# Patient Record
Sex: Male | Born: 1935 | Race: White | Hispanic: No | Marital: Married | Smoking: Former smoker
Health system: Southern US, Community
[De-identification: ages and names within clinical notes are randomized; demographics above are authoritative.]

## PROBLEM LIST (undated history)

## (undated) DIAGNOSIS — H3530 Unspecified macular degeneration: Secondary | ICD-10-CM

## (undated) DIAGNOSIS — I10 Essential (primary) hypertension: Secondary | ICD-10-CM

## (undated) DIAGNOSIS — R269 Unspecified abnormalities of gait and mobility: Secondary | ICD-10-CM

## (undated) DIAGNOSIS — I2510 Atherosclerotic heart disease of native coronary artery without angina pectoris: Secondary | ICD-10-CM

## (undated) DIAGNOSIS — N400 Enlarged prostate without lower urinary tract symptoms: Secondary | ICD-10-CM

## (undated) DIAGNOSIS — G20 Parkinson's disease: Secondary | ICD-10-CM

## (undated) DIAGNOSIS — E119 Type 2 diabetes mellitus without complications: Secondary | ICD-10-CM

## (undated) DIAGNOSIS — E785 Hyperlipidemia, unspecified: Secondary | ICD-10-CM

## (undated) DIAGNOSIS — I509 Heart failure, unspecified: Secondary | ICD-10-CM

## (undated) DIAGNOSIS — R5381 Other malaise: Secondary | ICD-10-CM

## (undated) DIAGNOSIS — G20A1 Parkinson's disease without dyskinesia, without mention of fluctuations: Secondary | ICD-10-CM

## (undated) DIAGNOSIS — Z951 Presence of aortocoronary bypass graft: Secondary | ICD-10-CM

## (undated) DIAGNOSIS — D649 Anemia, unspecified: Secondary | ICD-10-CM

## (undated) HISTORY — DX: Unspecified abnormalities of gait and mobility: R26.9

## (undated) HISTORY — DX: Essential (primary) hypertension: I10

## (undated) HISTORY — PX: CORONARY ARTERY BYPASS GRAFT: SHX141

## (undated) HISTORY — DX: Hyperlipidemia, unspecified: E78.5

---

## 1998-04-26 ENCOUNTER — Ambulatory Visit (HOSPITAL_COMMUNITY): Admission: RE | Admit: 1998-04-26 | Discharge: 1998-04-27 | Payer: Self-pay | Admitting: General Surgery

## 2000-03-28 ENCOUNTER — Encounter: Payer: Self-pay | Admitting: Emergency Medicine

## 2000-03-28 ENCOUNTER — Inpatient Hospital Stay (HOSPITAL_COMMUNITY): Admission: EM | Admit: 2000-03-28 | Discharge: 2000-04-07 | Payer: Self-pay | Admitting: Emergency Medicine

## 2000-03-30 ENCOUNTER — Encounter: Payer: Self-pay | Admitting: Cardiovascular Disease

## 2000-04-01 ENCOUNTER — Encounter: Payer: Self-pay | Admitting: Thoracic Surgery (Cardiothoracic Vascular Surgery)

## 2000-04-02 ENCOUNTER — Encounter: Payer: Self-pay | Admitting: Thoracic Surgery (Cardiothoracic Vascular Surgery)

## 2000-04-04 ENCOUNTER — Encounter: Payer: Self-pay | Admitting: Thoracic Surgery (Cardiothoracic Vascular Surgery)

## 2000-04-28 ENCOUNTER — Encounter
Admission: RE | Admit: 2000-04-28 | Discharge: 2000-04-28 | Payer: Self-pay | Admitting: Thoracic Surgery (Cardiothoracic Vascular Surgery)

## 2000-04-28 ENCOUNTER — Encounter: Payer: Self-pay | Admitting: Thoracic Surgery (Cardiothoracic Vascular Surgery)

## 2000-05-24 ENCOUNTER — Encounter: Payer: Self-pay | Admitting: Cardiovascular Disease

## 2000-05-24 ENCOUNTER — Ambulatory Visit (HOSPITAL_COMMUNITY): Admission: RE | Admit: 2000-05-24 | Discharge: 2000-05-24 | Payer: Self-pay | Admitting: Cardiovascular Disease

## 2003-12-03 ENCOUNTER — Observation Stay (HOSPITAL_COMMUNITY): Admission: RE | Admit: 2003-12-03 | Discharge: 2003-12-04 | Payer: Self-pay | Admitting: General Surgery

## 2003-12-03 ENCOUNTER — Encounter (INDEPENDENT_AMBULATORY_CARE_PROVIDER_SITE_OTHER): Payer: Self-pay

## 2004-08-22 ENCOUNTER — Ambulatory Visit (HOSPITAL_COMMUNITY): Admission: RE | Admit: 2004-08-22 | Discharge: 2004-08-22 | Payer: Self-pay | Admitting: Chiropractic Medicine

## 2007-06-11 ENCOUNTER — Inpatient Hospital Stay (HOSPITAL_COMMUNITY): Admission: EM | Admit: 2007-06-11 | Discharge: 2007-06-30 | Payer: Self-pay | Admitting: Emergency Medicine

## 2007-06-11 ENCOUNTER — Ambulatory Visit: Payer: Self-pay | Admitting: Internal Medicine

## 2007-06-13 ENCOUNTER — Encounter (INDEPENDENT_AMBULATORY_CARE_PROVIDER_SITE_OTHER): Payer: Self-pay | Admitting: Internal Medicine

## 2007-06-13 ENCOUNTER — Ambulatory Visit: Payer: Self-pay | Admitting: Internal Medicine

## 2007-06-22 ENCOUNTER — Ambulatory Visit: Payer: Self-pay | Admitting: Physical Medicine & Rehabilitation

## 2007-06-30 ENCOUNTER — Ambulatory Visit: Payer: Self-pay | Admitting: Physical Medicine & Rehabilitation

## 2007-06-30 ENCOUNTER — Inpatient Hospital Stay (HOSPITAL_COMMUNITY)
Admission: RE | Admit: 2007-06-30 | Discharge: 2007-07-11 | Payer: Self-pay | Admitting: Physical Medicine & Rehabilitation

## 2007-07-18 ENCOUNTER — Encounter (HOSPITAL_COMMUNITY): Admission: RE | Admit: 2007-07-18 | Discharge: 2007-10-16 | Payer: Self-pay | Admitting: Family Medicine

## 2007-09-06 ENCOUNTER — Emergency Department (HOSPITAL_COMMUNITY): Admission: EM | Admit: 2007-09-06 | Discharge: 2007-09-06 | Payer: Self-pay | Admitting: *Deleted

## 2008-03-04 IMAGING — CT CT ABDOMEN W/O CM
2 of 3 series · 17 of 46 positions shown, 19 images · IV contrast (agent unspecified)
Comparison: No prior studies are available for comparison.

CLINICAL DATA: Abdominal and pelvic pain.  Anemia.
ABDOMEN CT WITHOUT CONTRAST:
TECHNIQUE: Multidetector CT imaging of the abdomen was performed following the standard protocol without IV contrast. Oral contrast was given.
TECHNIQUE: Multidetector CT imaging of the pelvis was performed following the standard protocol without IV contrast. Oral contrast was given.

[Series 2: abd/pelv w/o 5.0 b31f st · axial · non-contrast · 0.87mm/px · z∈[-346,+68]mm · 14 of 97 slices shown, 16 images]
[im 7/97  soft-tissue]
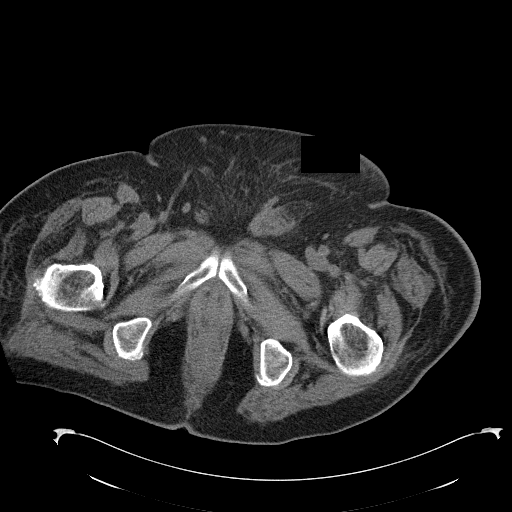
[im 7/97  bone]
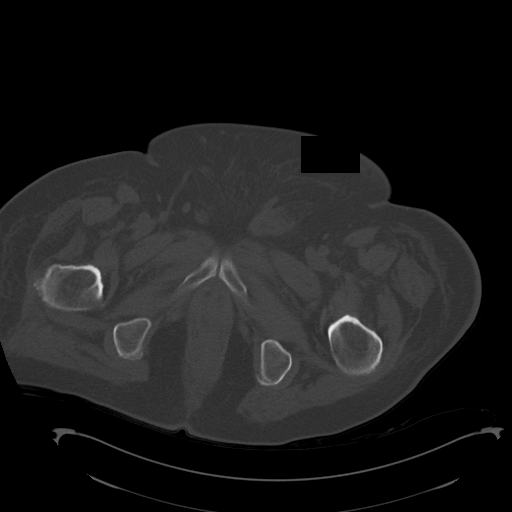
[im 13/97  soft-tissue]
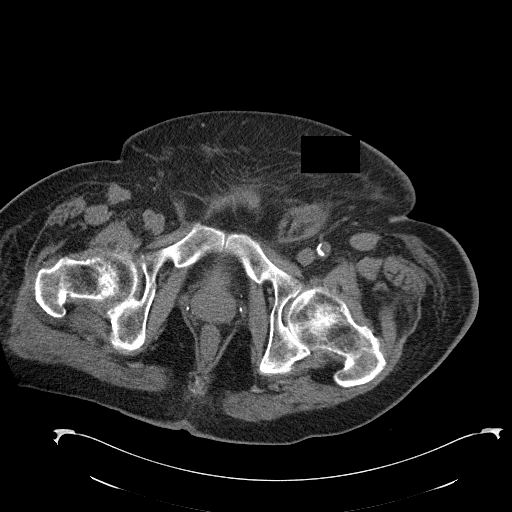
[im 19/97  soft-tissue]
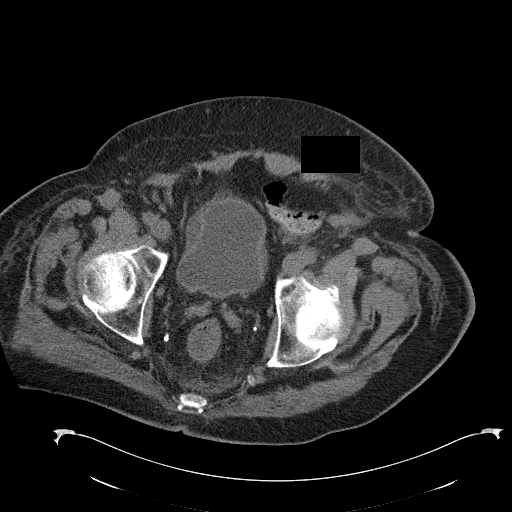
[im 25/97  soft-tissue]
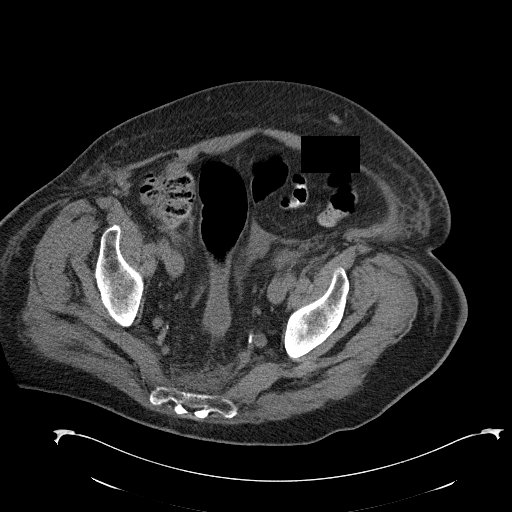
[im 31/97  soft-tissue]
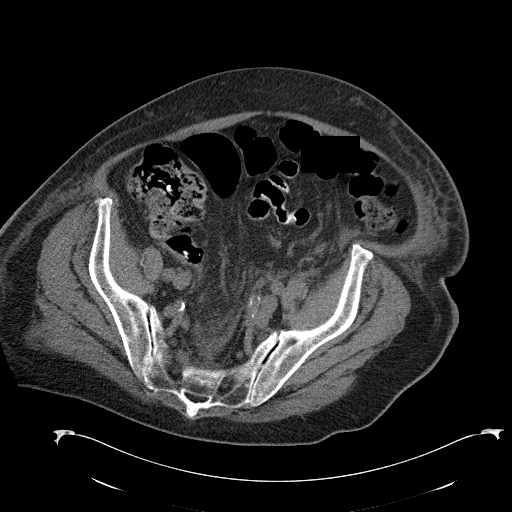
[im 38/97  soft-tissue]
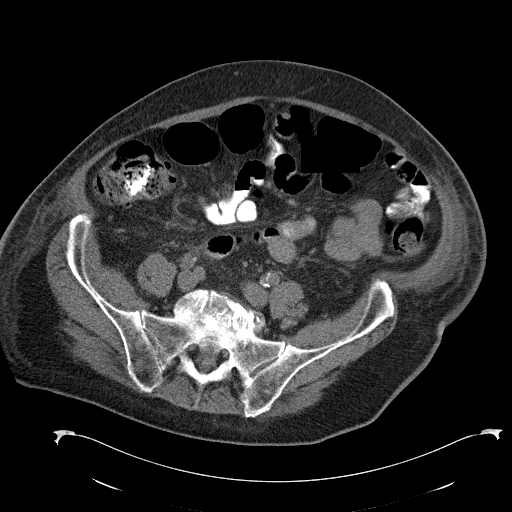
[im 44/97  soft-tissue]
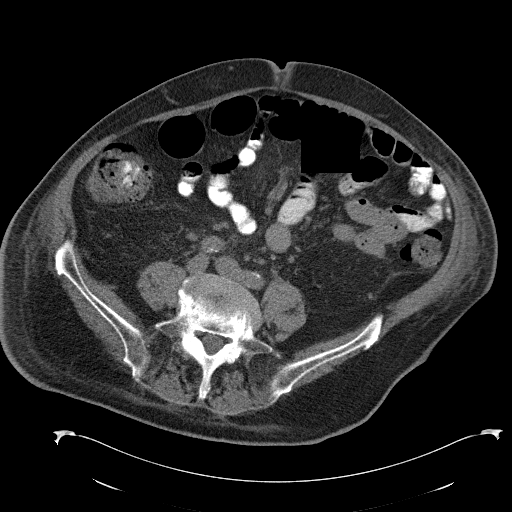
[im 53/97  soft-tissue]
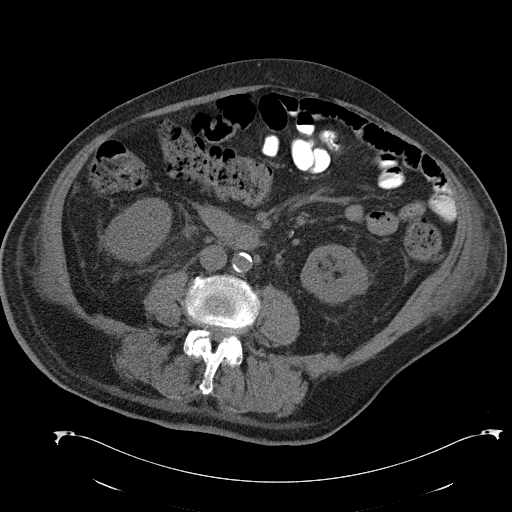
[im 59/97  soft-tissue]
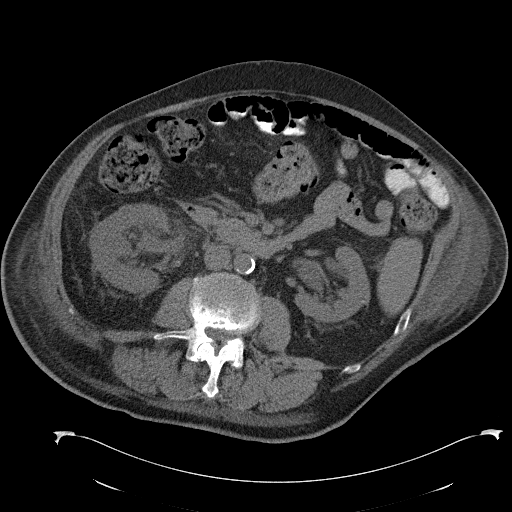
[im 59/97  bone]
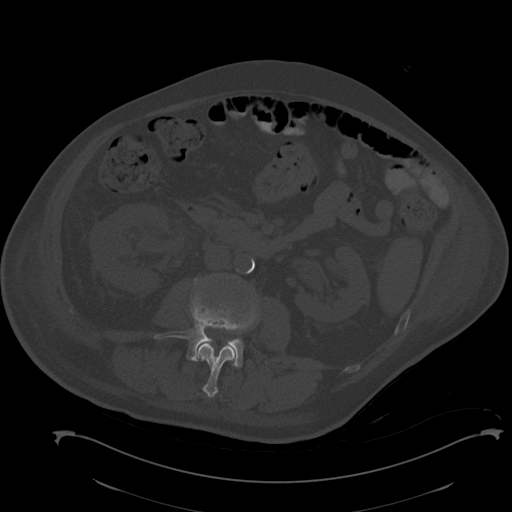
[im 66/97  soft-tissue]
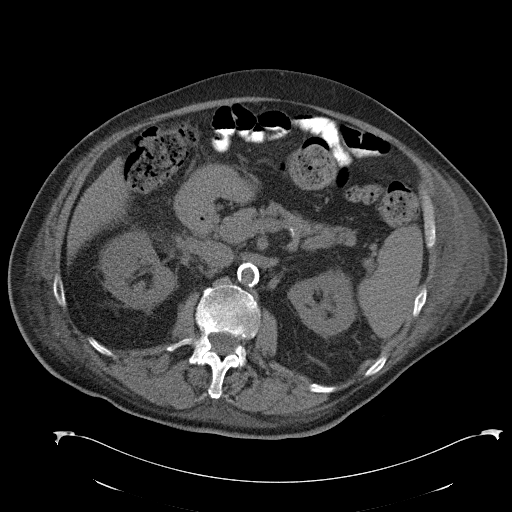
[im 72/97  soft-tissue]
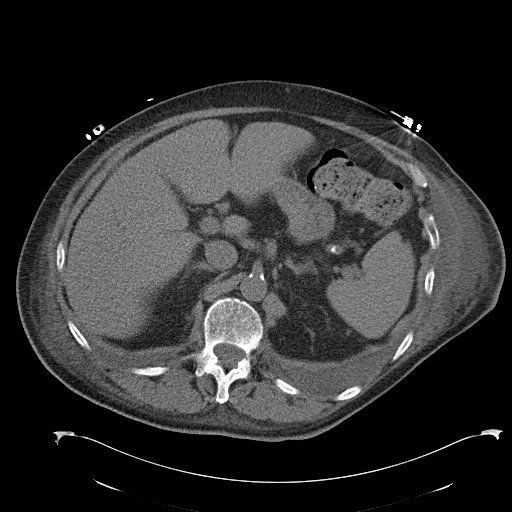
[im 78/97  soft-tissue]
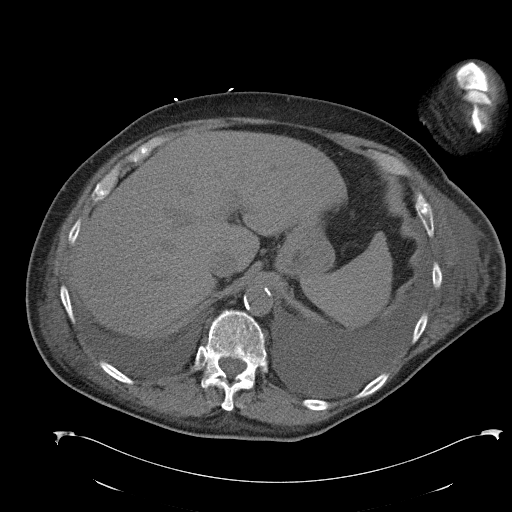
[im 84/97  soft-tissue]
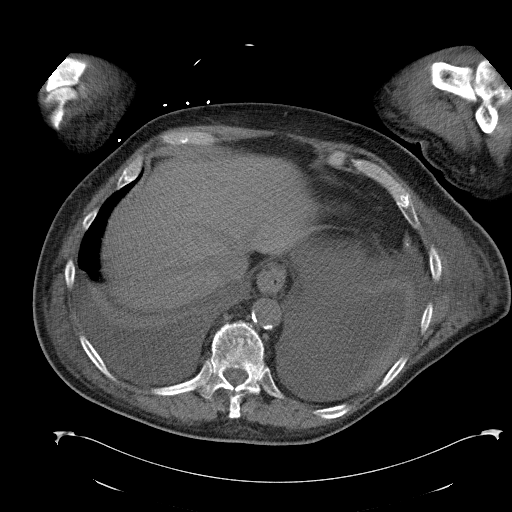
[im 90/97  soft-tissue]
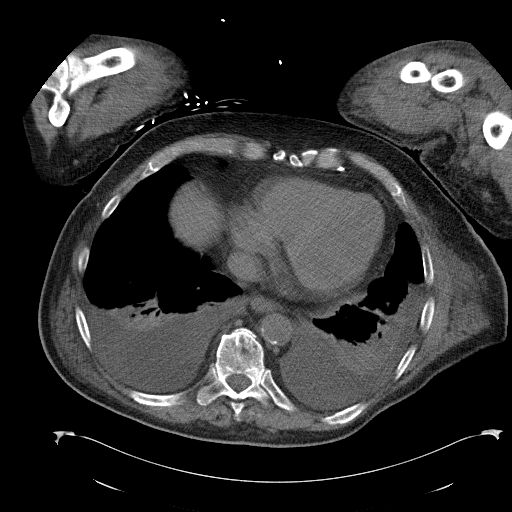

[Series 4: abd/pelv w/o 2.0 spo st · coronal · non-contrast · 0.95mm/px · 3 of 159 slices shown]
[im 53/159  soft-tissue]
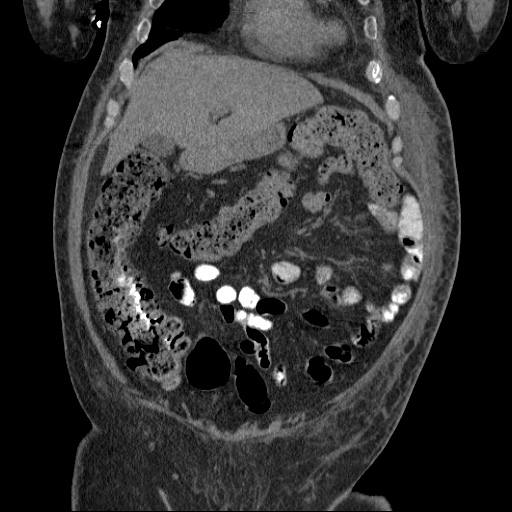
[im 71/159  soft-tissue]
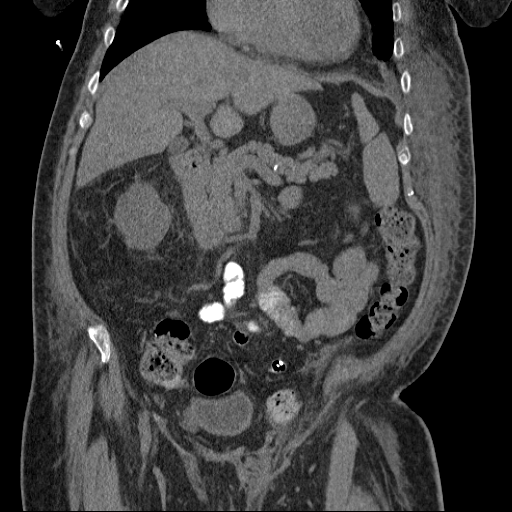
[im 88/159  soft-tissue]
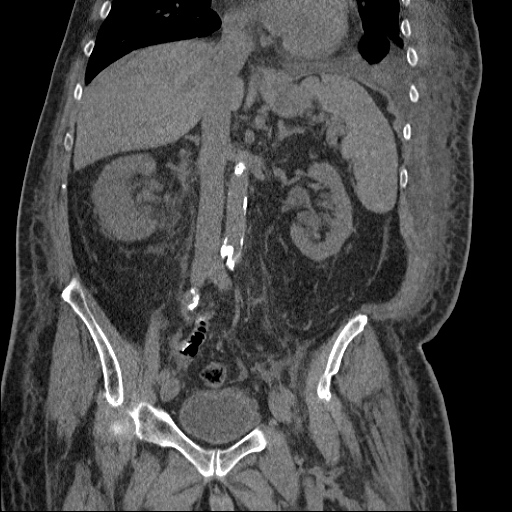

[17 of 46 positions shown; findings below may reference images not displayed]

FINDINGS: Small to moderate bilateral pleural effusions and bibasilar atelectasis identified. The liver, spleen, pancreas, adrenal glands, and pancreas are unremarkable. Right perinephric inflammation and fullness of the intrarenal collecting system is age indeterminant but appears to have been present on 06/13/07 MR. There are no urinary calculi present.  There may be a tiny gallstone present. Please note that parenchymal abnormalities may be missed as intravenous contrast was not administered. The visualized bowel is within normal limits. Subcutaneous edema.
IMPRESSION: 1.  ight perinephric inflammation of uncertain chronicity but appears to have been present on 06/13/07 MRI.
2.  Small to moderate bilateral pleural effusions and bibasilar atelectasis.  Subcutaneous edema. 
PELVIS CT WITHOUT CONTRAST:
FINDINGS: A small amount of free fluid in the pelvis is noted with diffuse subcutaneous edema.  A few small bladder diverticula are identified.  The visualized bowel and appendix are unremarkable.  No evidence of enlarged lymph nodes. A small left inguinal hernia containing fat is identified. Degenerative changes throughout the lumbar spine identified with unchanged L3 superior endplate compression fracture.
IMPRESSION: Small amount of free fluid and subcutaneous edema.

## 2008-03-06 IMAGING — CR DG CHEST 2V
1 series · 1 of 1 positions shown · non-contrast
Comparison: 06/28/07.

CLINICAL DATA: 84-year-old male with decondition, CHF, and fluid overload.
CHEST - 2 VIEW:

[w chest lat]
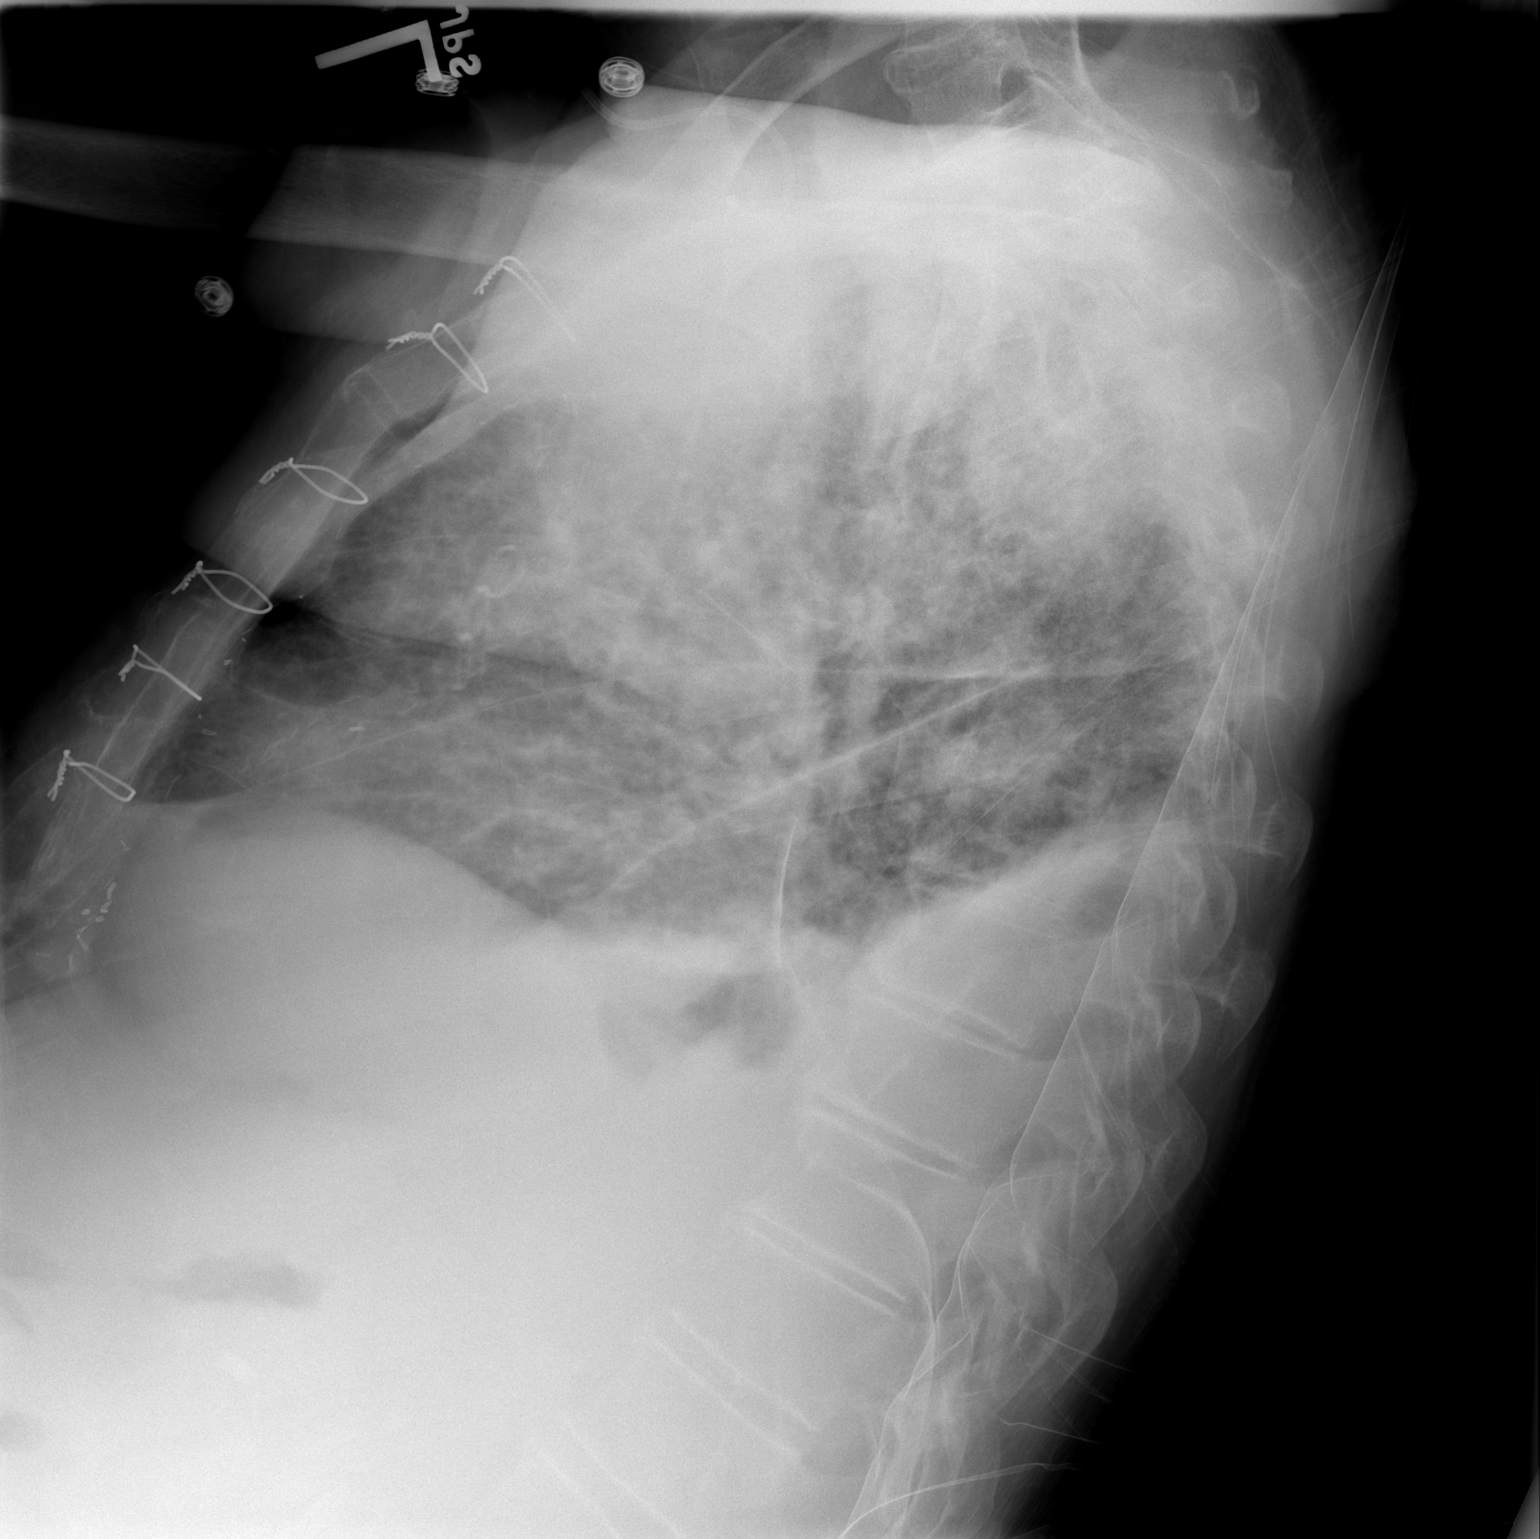

[1 of 1 positions shown; findings below may reference images not displayed]

FINDINGS: AP and lateral upright views of the chest demonstrate stable right PICC line catheter, cardiac size and mediastinal contour with post sternotomy changes.  Ongoing pulmonary edema and left greater than right pleural effusions with associated basilar atelectasis.  Overall, the degree of pulmonary vascular congestion appears slightly worse than on the prior exam.  On the lateral, the effusion size is slightly decreased.  Pleural fluid is still noted tracking into the fissures.
IMPRESSION: 1.  Stable to mildly worsened diffuse pulmonary edema. 
2.  Slightly decreased pleural effusions left greater than right.

## 2008-03-07 IMAGING — CR DG CHEST 2V
1 series · 1 of 1 positions shown · non-contrast
Comparison: 07/01/07.

CLINICAL DATA: 84-year-old male with CHF and deconditioning.
CHEST ? 2 VIEW:

[view not recorded]
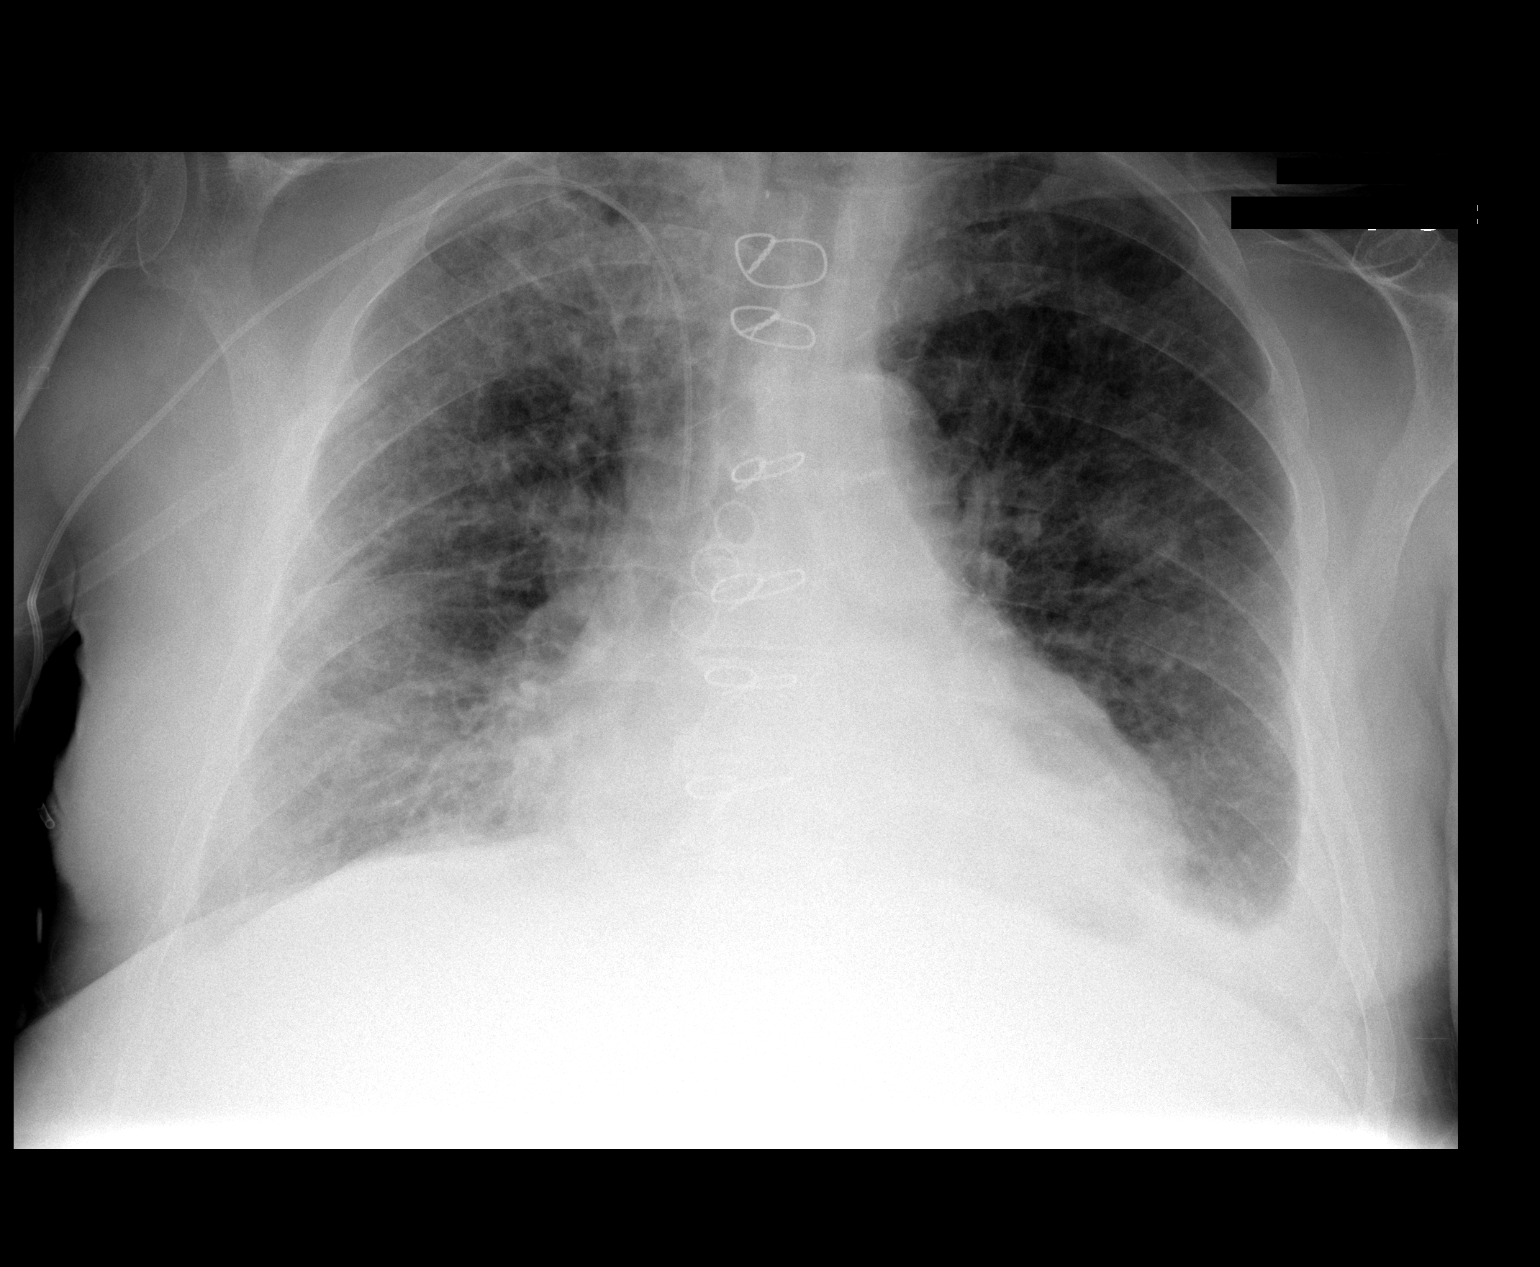

[1 of 1 positions shown; findings below may reference images not displayed]

FINDINGS: Right PICC line tip is in the upper SVC.  The patient is status post median sternotomy for coronary bypass.  The heart remains enlarged with stable pattern of interstitial edema and small effusions.  Basilar aeration/atelectasis has slightly improved.
IMPRESSION: 1. Improving bibasilar atelectasis.
2. Persistent interstitial edema pattern with effusions.

## 2011-04-21 NOTE — Discharge Summary (Signed)
NAMESHALEV, HELMINIAK              ACCOUNT NO.:  [id number]   MEDICAL RECORD NO.:  [id number]          PATIENT TYPE:  INP   LOCATION:  6739                         FACILITY:  [facility]   PHYSICIAN:  Ladell Pier, M.D.   DATE OF BIRTH:  08/24/1922   DATE OF ADMISSION:  06/11/2007  DATE OF DISCHARGE:  06/22/2007                               DISCHARGE SUMMARY   DISCHARGE DIAGNOSES:  1. Methicillin-sensitive Staphylococcus aureus (MSSA) bacteremia.  2. Hypokalemia.  3. Anemia.  4. Dysphagia.  5. Diabetes.  6. Thrombocytopenia.  7. Parkinson's disease.  8. Decreased mobility  9. Diarrhea.  10.Benign prostatic hypertrophy.  11.Coronary artery disease status post coronary artery bypass graft      surgery x4 in the past.  12.Delirium.  13.Gastric polypectomy in 2007.  14.Hemorrhoidectomy plus anopexy in 2004.  15.Remote tobacco history.  16.Mild ileus.   DISCHARGE MEDICATIONS:  1. Avandia 4 mg twice daily.  2. KCl 10 mEq daily.  3. Clorazepate dipotassium 3.75 mg twice daily.  4. Metoprolol 25 mg twice daily.  5. Zocor 20 mg daily.  6. Flomax 0.4 mg q.h.s.  7. Niaspan 500 mg q.h.s.  8. Lasix 20 mg daily.  9. Sinemet 4 times daily.  10.Plavix 75 mg daily.  11.Glucotrol XL 10 mg daily.  12.Cephazolin 2 grams IV q.12 h. x22 days.   CONSULTANTS:  Infectious disease, Dr. Maurice March; critical care and neurology,  Dr. Sandria Manly.   PROCEDURES:  PICC line placement.   FOLLOW-UP APPOINTMENTS:  The patient to follow up with primary care  physician in a week, Dr. Doristine Counter.   HISTORY OF PRESENT ILLNESS:  The patient is a 77 year old gentleman with  a history of heart disease, Parkinson's disease, diabetes, presented to  the emergency room after 2 days of decreased oral intake, nausea and  generalized weakness.  According to his wife, the patient had some  nausea.  She gave him Phenergan, but that did not improve the nausea.  He had increased tremors, generalized weakness, more so than what is  usually associated with his Parkinson's, complained of pain across his  hip and __________ she  brought him to the emergency room.   Past medical history, family history, social history, meds, allergies,  review of systems, per admission H&P.   PHYSICAL EXAM:  VITAL SIGNS:  Temperature 97.5, pulse of 78,  respirations 20, blood pressure 136/60 with a pulse of 94%.  CBG 230 to  244.  HEENT:  Normocephalic, atraumatic.  Pupils reactive to light  without erythema.  CARDIOVASCULAR:  Rhythm.  LUNGS:  Clear bilaterally.  ABDOMEN:  Soft, nontender, nondistended.  Positive bowel sounds.  EXTREMITIES:  2+ edema.   HOSPITAL COURSE:  1. Hypotension:  The patient was admitted to the hospital.  He was      noted to be hypotensive.  He was placed on pressors and IV fluids.      Over a few days in the hospital, his blood pressure improved on      pressors and IV fluids.  It was thought that most likely had      dehydrated.  He had  blood cultures, urine cultures, chest x-ray      done.  His blood cultures grew MSSA.  ID was consulted.  He was      placed on nafcillin which was switched to cephazolin, prior to      discharge.  Prior to the nafcillin, when the blood cultures      returned, he was vanc, Zosyn and doxycycline, doxycycline for      presumed [facility] Spotted Fever.  2. Thrombocytopenia:  He was noted to have thrombocytopenia during his      hospitalization, but as in the infection got better, the      thrombocytopenia improved, and he went back to normal platelet      function.  3. Acute renal failure:  He did develop acute renal failure with the      illness, but also once the patient was being treated with      antibiotics, the creatinine improved.  However, the day prior to      discharge, creatinine was 2.12.  Because his creatinine was over      1.5, he was not placed back on metformin for diabetes control but      put on Glucotrol.  He will follow up with his primary care       physician for further diabetes management.  4. Diabetes:  As mentioned above, he was changed from metformin to      Glucotrol, secondary to elevated creatinine.  5. Dysphagia:  He was noted to have problems with his food intake.      Speech pathology followed him and recommended a dysphagia diet.      The patient will be discharged home with instructions for his      family and dysphagia II diet.  6. Anemia:  The patient was also anemic.  He was transfused 2 units of      blood, and his hemoglobin remained stable after the transfusion.      Hemoglobin on discharge stable at 9.2.  7. Hypokalemia:  He was noted to be hypokalemic, most likely from      diarrhea or just from his renal failure getting better.  8. ATN.  Potassium was repleted and continued to be repleted probably      prior to discharge.  9. Parkinson's disease:  He was followed by Dr. Sandria Manly for his      Parkinson's disease.  10.Delirium:  He was noted to have delirium while he was in ICU, but      that also resolved and his mental status became clear after a few      days, and he was back to his baseline, per his wife.  He had a      spinal tap done that was normal with normal fluid.  He also was      checked for Herpes simplex in the spinal fluid that was normal.      Thyroid function was also negative.   1. Mild ileus:  The patient was having diarrhea.  Did a KUB, showed      mild ileus, but patient had no tenderness.  He was having diarrhea      with regular bowel movements, and the diarrhea resolved almost      prior to discharge.  He could have Imodium p.r.n.   DISCHARGE LABS:  Sodium 144, potassium 2.9, chloride 107, CO2 27,  glucose 215, BUN 37, creatinine 2.12, C. Diff toxin negative.  Blood  cultures:  The second set negative x2, which was done on June 14, 1999.  TSH 1.278, D-dimer of 14.63.  [place] Spotted Fever negative at  0.13, VDRL negative or nonreactive.   RADIOLOGY:  1. KUB showed generalized  ileus.  2. Renal ultrasound showed normal-sized kidneys with mild diffuse      renal parenchymal atrophy, incidental noted mild left pleural      effusion.  3. MRI of the spine, multilevel lumbar spine degenerative disease as      described without findings, suspicious for epidural abscess or      diskitis or osteomyelitis.  4. CT of the head, also negative.      Ladell Pier, M.D.  Electronically Signed     NJ/MEDQ  D:  06/21/2007  T:  06/22/2007  Job:  [id number]   cc:   Marjory Lies, M.D.

## 2011-04-21 NOTE — Discharge Summary (Signed)
NAMELORING, LISKEY              ACCOUNT NO.:  [id number]   MEDICAL RECORD NO.:  [id number]          PATIENT TYPE:  INP   LOCATION:  6739                         FACILITY:  [facility]   PHYSICIAN:  Ladell Pier, M.D.   DATE OF BIRTH:  09-02-1922   DATE OF ADMISSION:  06/11/2007  DATE OF DISCHARGE:                               DISCHARGE SUMMARY   ADDENDUM:  This is an addendum to the previous discharge summary done by  Dr. Ashley Royalty on the 22nd and myself on the 15th.  This is covering  discharge from the 22nd up to the 24th.  The patient had no new events  between the 22nd and the 24th.  Discussed the result of his CT scan with  him.  He had no hematoma.  X-ray of his wrist was normal.  His  hemoglobin has remained stable over the past 4 days.  He will be  transferred to rehab.  We was, however, given 2 units of blood prior to  transfusion.  His hemoglobin has been stable at 8.6.  Discussed with him  that he most likely has myelodysplastic syndrome.  We will continue to  monitor his hemoglobin.  It if, however, trends down over time, he could  come into short stay for transfusion and, at some in time, follow up  with hematology for possibly a bone marrow biopsy.   LABS AT THE TIME OF TRANSFER:  His hemoglobin 8.6, hematocrit 26.3.  X-  ray of the wrist, no acute abnormalities.      Ladell Pier, M.D.  Electronically Signed     NJ/MEDQ  D:  06/30/2007  T:  06/30/2007  Job:  [id number]

## 2011-04-21 NOTE — H&P (Signed)
NAMEELIGIO, ANGERT              ACCOUNT NO.:  [id number]   MEDICAL RECORD NO.:  [id number]          PATIENT TYPE:  EMS   LOCATION:  MAJO                         FACILITY:  [facility]   PHYSICIAN:  Altha Harm, MDDATE OF BIRTH:  May 07, 1922   DATE OF ADMISSION:  06/11/2007  DATE OF DISCHARGE:                              HISTORY & PHYSICAL   CHIEF COMPLAINT:  Nausea and weakness.   HISTORY OF PRESENT ILLNESS:  This is an 77 year old gentleman with a  history of coronary artery disease, Parkinson's disease and diabetes who  presents to the emergency room after a 2-day history of decreased oral  intake, nausea and generalized weakness.  According to the patient's  wife he awoke yesterday morning complaining of nausea, however, had no  episodes of emesis.  The patient's wife states that she gave him  Phenergan suppositories which caused him to go to sleep but did not seem  to improve the nausea once he was awake.  She states that the patient  has had also increased tremors and generalized weakness more than usual  associated with his Parkinson's disease.  He has only had a complaint of  pain across his left hip which started in the last 2 days and she states  that he felt warm to touch, however, his temperature is only 99.5.  The  patient denies any headaches, any neck pain, any change in mental  status, any diarrhea, any chest pain and dysuria.   PAST MEDICAL HISTORY:  Significant for:  1. Coronary artery disease, CABG times 4 in the past.  2. Diabetes type 2.  3. Hypertension.  4. Parkinson's disease.  5. Hyperlipidemia.  6. Benign prostatic hypertrophy.   FAMILY HISTORY:  Significant for hypertension and coronary artery  disease.   SOCIAL HISTORY:  The patient resides with his wife and usually is able  to ambulate with the walker at community level.  He denies any tobacco,  alcohol or drug use.   CURRENT MEDICATIONS:  Avandia 4 mg p.o. b.i.d., Tranxene 3.75 mg p.o.  b.i.d., metoprolol 25 mg p.o. b.i.d., Zocor 25 mg p.o. daily, Ocuvite 1  tablet p.o. daily, aspirin 81 mg p.o. daily, Flomax 0.4 mg p.o. daily,  Niaspan 500 mg p.o. h.s., Lasix 20 mg p.o. daily, potassium 10 mEq p.o.  daily, Sinemet ER 50 mg 4 times a day, Prandin 0.5 mg p.o. b.i.d.,  metformin 500 mg p.o. b.i.d., Plavix 75 mg p.o. daily and lisinopril 10  mg p.o. daily.   ALLERGIES:  THE PATIENT HAD NO ALLERGIES TO MEDICATIONS, HOWEVER, HE  DOES HAVE ADVERSE REACTIONS TO TYLOX AND ANTIHISTAMINES.   SOCIAL HISTORY:  His primary medical doctor is Dr. Doristine Counter.   CODE STATUS:  FULL CODE.   REVIEW OF SYSTEMS:  12 systems reviewed.  All systems are negative  except as noted in the HPI.   DATA:  1. Data review in the emergency room the patient had CT of the head      without contrast which shows no acute abnormalities.  2. Chest x-ray which showed a left upper lobe nodule and  recommendations with followup with CT scan.  3. Hemogram showed a white blood cell count of 9.9, hemoglobin 9.0,      hematocrit 27.4 platelet count of 94.  Sodium 137, potassium 3.9,      chloride 107, bicarb 21, BUN 30, creatinine 1.47.  Bilirubin is      mildly elevated at 1.6.  CK greater than 500 and a troponin of less      than 0.05.  lipase is pending on the patient.  Urinalysis was done      and shows no elements consistent with a urinary tract infection.   PHYSICAL EXAMINATION:  GENERAL:  The patient is in bed.  His daughter  and wife at the bedside and the patient appears to be in some degree of  distress.  He is having significant resting tremors.  VITAL SIGNS:  His blood pressure is 146/82, heart rate 101, rectal temp  of 100.  Respiratory rate is 26, O2 saturations are 97% on 2 liters.  HEENT:  The patient has a clear discharge from his eyes.  He is  normocephalic, atraumatic.  His pupils are equally round and reactive to  light and accommodation. I am unable to perform the funduscopic   examination due to the patient's inability to cooperate with exam.  He  has no conjunctival pallor and there is no icterus noted.  His  oropharynx is tachycardia, however, the patient is mouth breathing.  There is no exudate, erythema or lesions noted.  NECK:  The trachea midline.  There are no masses, no thyromegaly, no  JVD, no carotid bruit.  RESPIRATORY:  The patient is mildly tachypneic, however, he does not  appear to be labored in his breathing.  He has no wheezing or rales  noted.  CARDIOVASCULAR:  The patient has a normal S1 and S2.  There are no  murmurs, rubs or gallops noted.  The PMI is nondisplaced and there are  no heaves or thrills on palpation.  ABDOMEN:  The patient has a  protuberant abdomen, however, it is soft, it is nontender.  I am unable  to palpate any masses or any hepatosplenomegaly.  MUSCULOSKELETAL:  The patient has no joint tenderness, warmth or  erythema and he has no spinal tenderness.  I am unable to further  evaluate joint function and movement.  NEUROLOGIC:  The patient has a significant resting tremor. Gait has been  deferred at this time.  The patient is unable to cooperate any further  with the neurological examination secondary to the tremor.  The patient  does require assistance for bed mobility.  PSYCHIATRIC:  The patient is alert and oriented times 3.  However, he is  unable to contribute very more to the mental status examination at this  time due to his medical condition.   ASSESSMENT/PLAN:  This is a gentleman who presents with:  1. Hyperpyrexia.  2. Emesis.  3. Mild dehydration and mild renal insufficiency.  4. Anemia and thrombocytopenia.  The patient has a normal white blood      cell count, however, this may actually represent an elevated white      blood cell count in the face of a pancytopenia which will be      evaluated.  At this time I do not have a really good diagnosis for      the patient's constellation of symptoms and studies  are still being      performed.  We will start by giving the patient  supportive care in      the form of IV fluids, antinausea medications and antipyretic      medications.  We will cultures on the patient and await those.      Urinalysis has been done and does not show that the patient has a      urinary tract infection and the chest x-ray does not show any      elements consistent with a pneumonia.  I imagine within the next 24      hours, more data will be more revealing      of the patient's underlying condition.  His p.o. medications will      be held for right now due to the nausea and vomiting that he is      having and we will give him enalapril and Lopressor for his high      blood pressure.  His diabetes will be managed with sliding-scale      insulin, corrective insulin until the patient can tolerate p.o.s.      Altha Harm, MD  Electronically Signed     MAM/MEDQ  D:  06/11/2007  T:  06/11/2007  Job:  [id number]   cc:   Marjory Lies, M.D.

## 2011-04-21 NOTE — Discharge Summary (Signed)
NAMEKENDRICKS, [name]              ACCOUNT NO.:  [id number]   MEDICAL RECORD NO.:  [id number]          PATIENT TYPE:  IPS   LOCATION:  4002                         FACILITY:  [facility]   PHYSICIAN:  Ranelle Oyster, M.D.DATE OF BIRTH:  1922/01/27   DATE OF ADMISSION:  06/30/2007  DATE OF DISCHARGE:  07/11/2007                               DISCHARGE SUMMARY   DISCHARGE DIAGNOSES:  1. Toxic metabolic encephalopathy, resolved.  2. Acute renal failure, improving.  3. Congestive heart failure, compensated.  4. Methicillin-sensitive Staphylococcus aureus bacteremia.  5. Hypokalemia.  6. Diabetes mellitus type 2.  7. Anemia.  8. Diverticulosis and gastritis.   HISTORY OF PRESENT ILLNESS:  [name] is an 77 year old male with a  history of coronary artery disease, originally admitted on June 11, 2007  with nausea, vomiting, fever secondary to MSSA bacteremia.  He was  started on IV antibiotics for septic syndrome and ID recommended the  patient completing 4 weeks total of antibiotic therapy.  The patient was  noted to have issues with confusion secondary to toxic metabolic  encephalopathy and delirium per Dr. Imagene Gurney input.  The patient also was  noted to be anemic.  CT of the abdomen and pelvis was negative.  The  patient with an episode of hematochezia, and Dr. Elnoria Howard, GI, was consulted  for input.  Flexible sigmoidoscopy and EGD done on June 28, 2007  revealed internal and external hemorrhoids with gastritis.  The patient  has received multiple units of packed red blood cells with the most  recent H&H of 8.6 and 25.7.  The patient did develop dyspnea and  hypoxia.  VQ scan done was negative for PE.  However, the patient was  noted to have edema with pleural effusion and has been treated with a  low-dose diuretic intermittently.  Therapies have been ongoing, and the  patient is noted to have impairments in mobility and self-care.  Continues to have hypoxia requiring 4 liters O2.  Rehab  was consulted  for progressive therapies.   PAST MEDICAL HISTORY:  1. Coronary artery disease with CABG.  2. BPH.  3. DM type 2 diabetes.  4. Parkinson's disease.  5. Hypertension.  6. Macular degeneration.  7. Anemia with baseline hemoglobin 10.   ALLERGIES:  1. TYLOX.  2. ANTIHISTAMINE.   FAMILY HISTORY:  Positive for coronary artery disease and  osteoarthritis.   SOCIAL HISTORY:  The patient is married.  Lives in a two-level home with  three steps at entry.  Does not use any tobacco or alcohol.  Was  independent for ambulation with use of cane.  Was driving short  distances.   HOSPITAL COURSE:  [name] was admitted to rehabilitation on  June 30, 2007 for inpatient therapies to consist of PT and OT daily.  Labs done past admission revealed hemoglobin 9.7, hematocrit 28.9.  Check of lytes revealed sodium 138, potassium 3.2, chloride 100, CO2 of  31, BUN 21, creatinine 1.56, glucose 95.  The patient was noted to have  labored breathing with crackles on July 01, 2007 with evidence of fluid  overload.  Chest  x-ray was ordered, and the patient was treated with IV  Lasix secondary to diffuse pulmonary edema noted on chest x-ray.  His  hypokalemia was supplemented aggressively initially.  The patient's  respiratory status did improve with an increased dose of diuretic.  Last  chest x-ray of July 06, 2007 shows pleural effusions, pulmonary edema,  pattern unchanged.  The patient's hypoxia overall has resolved, and the  patient was weaned off O2 during this stay.  The patient's renal status  was noted to be worsening secondary to diuretics.  Lasix was placed on  hold on July 06, 2007.  A check of lytes revealed a BUN of 32,  creatinine of 2.46.  Dr. Briant Cedar with the renal service was consulted  for input.  He recommended discontinuing Zaroxolyn and Lasix for now.  He felt the patient's acute renal failure was most likely secondary to  diuresis.  He also recommended  starting Aranesp for the patient's  anemia.   The patient's renal status has improved off of diuretics.  Most recent  lytes from July 11, 2007 revealed a sodium of 139, potassium 3.4,  chloride 103, CO2 of 28, BUN 31, creatinine 1.90, glucose 111.  CBC  reveals H&H of 8.1 and 24.1.  The patient continues on iron supplements  and will continue with Aranesp every other week to help for treatment of  anemia past discharge.  Lasix was resumed at 20 mg a day secondary to  increase in peripheral edema that is noted off diuretics.  The patient's  blood pressures have been checked on a b.i.d. basis and currently are  ranging from the 130s to occasional high in the 150s systolic, 60s to  70s diastolic.  CBG checks have been monitored on an a.c. and h.s. basis  and have ranged from the 90s to occasional high in the 170s.  The  patient currently continues on Glucotrol XL 10 mg a day.  Resumption of  Prandin to be done on an outpatient basis per LMD input.  Other workup  during this stay includes check of iron studies revealing iron of 62,  TIBC 202, iron saturation 31, TIBC 140.  UA, UC was done showing 1000  colonies of multi species.  Urine electrophoresis shows a total protein  of 6.9.  Full results currently pending.   The patient's mental status has really improved during this stay.  Insomnia was treated with low-dose Risperdal.  The patient to continue  on this over the next few weeks and taper as gets situated back to being  at home.  During his stay in rehabilitation, Ms. Kobashigawa has progressed  along well.  Endurance is much improved.  Currently, the patient is able  to perform ADLs at sink with supervision.  Requires some assist for  dressing upper body secondary to __________  has focused on upper  extremity and lower extremity exercises to help with grip strength and  endurance.  The patient has been able to ambulate up to 100 feet x3 with  a rolling walker.  He requires supervision to  occasional minimum assist  secondary to balance issues.  The patient will continue with further  follow-up home health PT, OT by [facility] past discharge.  On  July 11, 2007, the patient is discharged to home.   DISCHARGE MEDICATIONS:  1. Lasix 20 mg a day.  2. K-Dur 20 mEq a day.  3. Tylenol 325 mg one to two p.o. q.6-8h. p.r.n. pain.  4. Glucotrol XL 10  mg a day.  5. Sinemet 25-100 p.o. q.i.d.  6. Lopressor 50 mg half p.o. b.i.d.  7. Ocuvite one per day.  8. Ancef 2 grams IV q.12h.  9. Risperdal 0.25 mg q.h.s. for sleep.  10.Ferrous sulfate 225 mg b.i.d.  11.Protonix 40 mg b.i.d.  12.Lasix 20 mg a day.  13.K-Dur 20 mEq a day.   DIET:  Diabetic diet with low salt.   SPECIAL INSTRUCTIONS:  Intermittent supervision.  For now, use walker.  No alcohol, no smoking, no driving.  Do not use Avandia, metformin,  lisinopril, Prandin, Plavix.   FOLLOWUP:  The patient to follow up with Dr. Doristine Counter on July 18, 2007  at 11:20.  Follow up with Dr. Sandria Manly and Dr. Alanda Amass for routine checks.  Follow up with Dr. Riley Kill as needed.      Greg Cutter, P.A.      Ranelle Oyster, M.D.  Electronically Signed    PP/MEDQ  D:  07/11/2007  T:  07/11/2007  Job:  [id number]   cc:   Marjory Lies, M.D.  Richard A. Alanda Amass, M.D.  Evie Lacks, MD  Dyke Maes, M.D.

## 2011-04-21 NOTE — H&P (Signed)
[name], [name]              ACCOUNT NO.:  [id number]   MEDICAL RECORD NO.:  [id number]          PATIENT TYPE:  IPS   LOCATION:  4002                         FACILITY:  [facility]   PHYSICIAN:  Ranelle Oyster, M.D.DATE OF BIRTH:  [birth year]   DATE OF ADMISSION:  06/30/2007  DATE OF DISCHARGE:                              HISTORY & PHYSICAL   PRIMARY CARE PHYSICIAN:  Marjory Lies, M.D.   CARDIOLOGIST:  Richard A. Alanda Amass, M.D.   NEUROLOGIST:  Genene Churn. Love, M.D.   PSYCHIATRIST:  Antonietta Breach, M.D.   HISTORY OF PRESENT ILLNESS:  Mr. Riviera is an 77 year old Caucasian male  with a history of coronary artery disease and idiopathic Parkinson's  disease.  He was admitted June 11, 2007 with nausea and vomiting along  with fever.  CT of the brain was negative.  Chest x-ray showed no  pneumonia.  The patient was noted to have mild renal insufficiency along  with anemia and fever of 104.2.  He was started on IV antibiotics for  sepsis syndrome.  Blood culture came back positive for methicillin-  sensitive Staphylococcus aureus, and infectious disease recommended 4  weeks of antibiotics.  The patient remained with confusion secondary to  toxic metabolic encephalopathy with delirium per Dr. Imagene Gurney input.  He  continued with intermittent confusion.   The patient has had issues regarding anemia.  CT of the abdomen and  pelvis was negative.  Dr. Elnoria Howard was consulted for episodes of  hematochezia and recommended serial hemoglobin and hematocrit along with  transfusion as necessary.  A flexible sigmoidoscopy and EGD was done  July 29, 2007 with internal and external hemorrhoids along with  diverticular disease and gastritis noted.  It was planned to receive 2  units of packed red blood cells additionally today.  He has some hypoxia  with a VQ scan being negative for pulmonary embolism, but with pulmonary  edema and pleural effusions.  He has had 2+ pitting edema peripherally  and was  treated with low-dose Lasix intermittently.   Therapies have been ongoing.  The patient requires minimum assist for  mobility and ambulation 50 feet requiring 4 liters of 02 by nasal  cannula.  At the end of the bed, he has posterior leaning when sitting  or standing and requires set up for grooming.   The patient was evaluated by the rehabilitation physicians and felt to  be an appropriate candidate for inpatient rehabilitation.   REVIEW OF SYSTEMS:  Positive for edema, a rash of his groin, weakness.   PAST MEDICAL HISTORY:  1. A history of coronary artery disease with prior CABG.  2. Type 2 diabetes mellitus.  3. Parkinson's disease.  4. Hypertension.  5. Macular degeneration.  6. Anemia with baseline hemoglobin of 10.0.  7. Benign prostatic hypertrophy.   FAMILY HISTORY:  Positive for coronary artery disease and  osteoarthritis.   SOCIAL HISTORY:  The patient is married and lives with his wife in a 2-  level home with 3 steps to enter.  He does not use alcohol or tobacco.  His wife has minimal ambulatory abilities using a  single point cane.   FUNCTIONAL HISTORY PRIOR TO ADMISSION:  The patient was independent for  ambulation using a cane and drove short distances.   ALLERGIES:  1. TYLOX.  2. ANTIHISTAMINES.   MEDICATIONS PRIOR TO ADMISSION:  1. Avandia 4 mg a day.  2. Ocuvite daily.  3. Lisinopril 10 mg daily.  4. Lasix 20 mg daily.  5. Sinemet daily.  6. Prandin 0.5 mg b.i.d.  7. Metoprolol 25 mg b.i.d.  8. Potassium chloride 10 mEq daily.  9. Flomax 0.4 mg q.h.s.  10.Niaspan 500 mg q.h.s.  11.Plavix 75 mg daily.  12.Metformin 500 mg b.i.d.   LABORATORY DATA:  Most recent hemoglobin measured June 30, 2007 was 8.6  and hematocrit of 26.3.  Most recent sodium was 140, potassium 3.7,  chloride 104, CO2 of 30, BUN 20, creatinine 1.55 and glucose of 81.  BNP  measured June 27, 2007 was decreased to 603.  Chest x-ray on June 28, 2007 showed minimally improved  aeration with patchy air space disease,  most consistent with edema.  X-rays of his left wrist showed diffuse  bony demineralization.   PHYSICAL EXAMINATION:  GENERAL:  Reasonably well-appearing, slow-moving  adult male in mild discomfort involving his left wrist.  He is able to  answer questions appropriately but slowly.  VITAL SIGNS:  Blood pressure is 146/74 with a pulse of 84, respiratory  rate of 24 and temperature 98.9 with CBGs of 178, 144 and 116.  HEENT:  Normocephalic and non-traumatic.  CARDIOVASCULAR:  Regular rate and rhythm.  S1 and S2 without murmurs.  ABDOMEN:  Soft, obese, nontender, with positive bowel sounds.  LUNGS:  Decreased breath sounds at the bases.  NEUROLOGIC:  Alert and oriented x3 with occasional cues.  Extraocular  muscles were intact, and tongue was in the midline.  Facial symmetry was  intact bilaterally.  Bilateral upper extremity exam showed 4-/5 strength  throughout with some complaints of pain in his left wrist with any  pressure.  Sensation was intact to light tough for the bilateral upper  extremities.  Lower extremity exam showed 4-/5 strength in hip flexion,  knee extension and ankle dorsiflexion.  Sensation was intact to light  touch with the bilateral lower extremities.  There was +2 pitting edema  of the lower extremities below knee level.   IMPRESSION:  1. Toxic metabolic encephalopathy with fluctuating mental status      secondary to methicillin-sensitive Staphylococcus aureus (sepsis      syndrome).  2. Recent development of hematochezia with the finding of internal and      external hemorrhoids along with diverticula and gastritis.   Presently, the patient has deficits in ADL's, transfers, ablation,  cognition and household management secondary to the above-noted toxic  metabolic encephalopathy as a result of methicillin-sensitive  Staphylococcus aureus bacteremia/septicemia.  The patient also has had a  recent development of  hematochezia with the EGD/endoscopy findings as  noted above.   PLAN:  1. Admit to the rehabilitation unit for daily therapies to include      physical therapy for range of motion, strengthening, bed mobility,      transfers, pre-gait training, gait training and equipment      evaluation.  2. Occupational therapy for range of motion, strengthening, ADL's,      cognitive/perceptual training, splinting and equipment evaluation.  3. Rehabilitation nursing for skin care, wound care and bowel and      bladder training as necessary.  4. Speech therapy for higher  level cognition and evaluation of      swallowing.  5. Case management to assess home environment, assist with discharge      planning and arrange for appropriate follow up care.  6. Social worker to assess family and social support and assist in      discharge planning.  7. Check admission labs including CBC and CMET in a.m., July 01, 2007.  8. Carbohydrate-modified medium diet with no added salt, D-3 with thin      liquids.  9. Lymphedema team for wrapping of bilateral lower extremities.  10.Keep scrotum elevated on rolled towel when in bed.  11.Nystatin powder to the inguinal groin b.i.d.  12.Ocuvite one p.o. daily.  13.Maintain saline lock.  14.Lopressor 25 mg p.o. b.i.d.  15.Sinemet 25/100 one p.o. q 7 a.m., 11 a.m., 3 p.m. and 7 p.m.  16.Zaroxolyn 2.5 mg p.o. daily.  17.Glucotrol XL 10 mg p.o. daily.  18.Lasix 20 mg p.o. daily.  19.Velcro splint for the left wrist.  20.IV Ancef 2 gm q.12h. through July 14, 2007.  21.Iron sulfate 325 mg p.o. b.i.d.  22.Complete transfusion of 2 units of packed red blood cells with use      of IV Lasix 40 mg IV between units.  23.Ice pack to the left wrist t.i.d. and p.r.n.  24.Protonix 40 mg p.o. daily.  25.Cartia 40 mg p.o. b.i.d.  26.Tylenol 650 mg p.o. q.i.d.  27.Risperdal 0.25 mg p.o. q.h.s.  28.Diflucan 100 mg p.o. daily x3 days.  29.CBGs a.c. and h.s.  30.In and out  catheterizations for no void or post voiding residuals      greater than 300 cc.   PROGNOSIS:  Fair to good.   ESTIMATED LENGTH OF STAY:  15-25 days.   GOALS:  Supervision to modified independent ADL's, transfers and short  distance ambulation with longer distance ambulation and modified  independent at wheelchair level.     ______________________________  Ellwood Dense, M.D.      Ranelle Oyster, M.D.  Electronically Signed    DC/MEDQ  D:  06/30/2007  T:  06/30/2007  Job:  [id number]

## 2011-04-21 NOTE — Consult Note (Signed)
NAMESHAHRUKH, PASCH              ACCOUNT NO.:  [id number]   MEDICAL RECORD NO.:  [id number]          PATIENT TYPE:  INP   LOCATION:  6739                         FACILITY:  [facility]   PHYSICIAN:  Jordan Hawks. Elnoria Howard, MD    DATE OF BIRTH:  1922/01/04   DATE OF CONSULTATION:  06/23/2007  DATE OF DISCHARGE:                                 CONSULTATION   REASON FOR CONSULTATION:  Acute anemia.   HISTORY OF PRESENT ILLNESS:  This is an 77 year old gentleman with past  medical history of Parkinson's disease, diabetes, coronary artery bypass  graft x4 vessels and hyperlipidemia who was initially admitted to the  hospital on 06/11/2003 for fever of unknown origin.  However, through  this hospitalization workup did reveal that he has methicillin-sensitive  staph aureus.  Over the course of his hospitalization he has done well  and has is an relatively afebrile since the start of his antibiotics  which he has 20 more days.  Because of the hospitalization and his  illness he has subsequently worsened in regards to his clinical status  as he has become markedly debilitated was a significant amount of lower  extremity edema.  There is no evidence of any cellulitis at the  potential cause for his infectious source.  The patient was started on  anticoagulation with past couple days for routine prophylactic measures  and subsequently he developed hematochezia.  He has dropped his  hemoglobin from the mid 9 range to 8.3 and then currently at 7.8.  The  patient did undergo an EGD and colonoscopy by Dr. Elnoria Howard in 2007 with  findings of a single diverticulum in the ascending colon and a polypoid  lesion in the antrum which was felt to be a source of his iron  deficiency anemia at the initial evaluation.  Removal of that polyp did  seem to resolve his decline in his hemoglobin at that time and he was  released from routine follow-up from the office.  However, at this time  because of hematochezia, GI was  requested to reevaluate the patient.   PAST MEDICAL AND SURGICAL HISTORY:  As stated above.   FAMILY HISTORY:  Noncontributory.   SOCIAL HISTORY:  The patient lives with his wife.  No alcohol, tobacco,  illicit drug use.   MEDICATIONS:  Carbidopa, cephazolin, glipizide, insulin, metoprolol,  Tylenol, enalapril, Haldol, ibuprofen, loperamide, Zofran and Carafate.   ALLERGIES:  OXYCONTIN AND ANTIHISTAMINES.   REVIEW OF SYSTEMS:  Unable to adequately ascertain as the patient is not  completely coherent at this time.   PHYSICAL EXAM:  VITAL SIGNS:  Blood pressure is 116/56, heart rate is  78, temperature is 97.6, respirations 20.  GENERAL:  The patient is very weak in appearance with some evidence of  labored breathing on nasal cannula.  HEENT:  Normocephalic, atraumatic.  Extraocular muscles intact.  NECK:  Neck is supple.  No lymphadenopathy.  LUNGS:  Clear to auscultation bilaterally on the anterior fields.  CARDIOVASCULAR:  Regular rate and rhythm.  ABDOMEN:  Obese, soft, nontender, nondistended.  EXTREMITIES:  4+ pitting edema noted up to the  upper thighs and in his  scrotum.  RECTAL:  Examination is negative for any evidence of fresh blood.  There  is brown stool on the examination glove.   LABORATORY VALUES:  White blood cell count 7.7, hemoglobin 7.9, MCV is  85.4, platelets at 224.  Sodium 143, potassium 3.6, chloride 110, CO2  26, glucose 185, BUN is 2, creatinine 2.1, calcium 7.6, magnesium is  2.1.   IMPRESSION:  1. Hematochezia.  2. MSSA bacteremia.  3. Markedly debilitated state.  After evaluation of the patient in      regards to his current clinical status as well as the rectal      examination, I did not recommend an endoscopic evaluation at this      time.  It is most likely secondary to his anticoagulation and may      have been a hemorrhoidal source.  The rectal exam again is negative      for any evidence of fresh blood.  Certainly if the patient       continues to have bleeding, then a flexible sigmoidoscopy can be      performed with possible endoscopy.  However, at this time the plan      is to monitor the patient clinically, follow his hemoglobin and      agree with transfusion.      Jordan Hawks Elnoria Howard, MD  Electronically Signed     PDH/MEDQ  D:  06/23/2007  T:  06/24/2007  Job:  [id number]   cc:   Marjory Lies, M.D.

## 2011-04-21 NOTE — Consult Note (Signed)
NAMECYREE, [name]              ACCOUNT NO.:  [id number]   MEDICAL RECORD NO.:  [id number]          PATIENT TYPE:  INP   LOCATION:  6739                         FACILITY:  [facility]   PHYSICIAN:  [name], M.D.  DATE OF BIRTH:  [birth year]   DATE OF CONSULTATION:  06/23/2007  DATE OF DISCHARGE:  06/30/2007                                 CONSULTATION   REASON FOR CONSULTATION:  Severe agitation.   HISTORY OF PRESENT ILLNESS:  [name] is an 77 year old male admitted  to the [facility] on July 5th due to nausea and weakness.   [name] does have a number of general medical problems.  He has  Parkinson disease.  He has coronary artery disease.  He developed  progressive weakness, judgment and executive function.  He displays  memory difficulty, as well as confusion and clouding of consciousness.  He has displayed severe agitation.  Attempts to calm him with Ativan  have failed.  His agitation is interfering with critical bedside care.   The patient's mental status did clear for a number of days in the past  week; however, prior to that time in the intensive care unit, the  patient was also displaying confusion.  He underwent an organic workup.  Please see the discussion below.  Over the past 12 hours, the patient  has required 1 mg of Ativan as well as 4 mg of Haldol for severe  agitation.   PAST PSYCHIATRIC HISTORY:  [name] does have Parkinson disease.  On  review of the past medical record, the patient was on Sinemet as far  back as April 2001.   The patient also has a history of long-term feeling on edge and muscle  tension.  He was treated with Tranxene 3.75 mg b.i.d. as early as 2001.  The Tranxene has been maintained at that dosage up until the present  admission.   FAMILY PSYCHIATRIC HISTORY:  None known.   SOCIAL HISTORY:  [name] was residing with his wife prior to  admission.  He was ambulating with his walker recently until his  weakness developed  prior to admission.  Occupation - retired.  Alcohol -  none.  Illegal drugs - none.  Religion is [facility].   The patient is followed by [facility].   PAST MEDICAL HISTORY:  1. Parkinson disease.  2. Diabetes.  3. Coronary artery disease with a history of CABG.   MEDICATIONS:  The MAR is reviewed.  The patient is on his standing  Sinemet.  Otherwise he is not on any standing psychotropic medications.  Please see the above discussion regarding the patient's p.r.n. Haldol  and Ativan today.   ALLERGIES:  PERCOCET.   LABORATORY DATA:  WBC 7.7, hemoglobin 7.9, platelet count 224.  The  basic metabolic panel is remarkable for an elevated glucose of 185, BUN  is 26, creatinine elevated at 2.17, calcium is slightly low at 7.6,  magnesium is 2.1.   Head CT without contrast on June 11, 2007 was negative.   REVIEW OF SYSTEMS:  CONSTITUTIONAL:  Afebrile.  No weight loss.  HEAD:  No trauma.  EYES:  No visual changes.  EARS:  No hearing impairment.  NOSE:  No rhinorrhea.  MOUTH/THROAT:  No sore throat.  NEUROLOGIC:  The  patient did undergo an organic workup for his delirium while he was in  the ICU last week.  The thyroid function tests were negative.  He also  had a lumbar puncture that showed normal CSF.  His herpes simplex test  was negative.  PSYCHIATRIC:  As above.  CARDIOVASCULAR:  No current chest pain or  palpitations.  RESPIRATORY:  No coughing or wheezing.  GASTROINTESTINAL:  As above.  GENITOURINARY:  No dysuria.  SKIN:  Unremarkable.  HEMATOLOGIC/LYMPHATIC:  Anemia.  MUSCULOSKELETAL:  No deformities.  ENDOCRINE/METABOLIC:  As above.   PHYSICAL EXAMINATION:  VITAL SIGNS:  Temperature 97.6, pulse 80,  respirations 22, blood pressure 145/67, O2 saturation on 1 liter is 96%.  GENERAL APPEARANCE:  [name] is an elderly male lying in a supine  position in his hospital bed.  He has no abnormal involuntary movements.  His body habitus is within normal limits.   He is well-groomed.  MENTAL STATUS EXAM:  [name] shows slight stupor.  He has a decreased  attention span.  He is oriented to self.  His memory is impaired for  short-term and recent.  His speech is disorganized.  His fund of  knowledge and intelligence are below that of his estimated premorbid  baseline.  Thought process is disorganized.  His abstraction and  calculating abilities are impaired.  His language comprehension and  expression are impaired.  Thought content shows no evidence of  hallucinations or delusions at the time of the exam.  There is no  evidence of destructive thoughts towards self or others.  His affect is  slightly anxious.  Mood is slightly anxious.  Insight is poor.  Judgment  is impaired.   ASSESSMENT:  AXIS I:  293.00 delirium due to general medical problems.  The causes appear multifactorial including most importantly his profound  anemia.  Other factors likely include the age of his brain and  bacteremia.  AXIS II:  None.  AXIS III:  See general medical problems.  AXIS IV:  General medical.  AXIS V:  20.   RECOMMENDATIONS:  1. If the blood transfusion to correct the patient's anemia does not      result in resolution of his delirium, would start a low-dose of an      antipsychotic such as Zyprexa 2.5 mg daily standing at 6 p.m. and      titrate by 2.5 mg per day to the initial trial dose of 7.5 mg      q.a.m.  2. Would continue his Haldol and Ativan p.r.n. dosing for breakthrough      agitation.  3. Would monitor his QTC.  If it rises above 500 milliseconds, would      stop his antipsychotics.  4. The dosage needed in a standing antipsychotic regimen to eliminate      psychosis is not as high as those typically      used to control severe agitation and combativeness, i.e., when      antipsychotics are used for major tranquilization.  The duration of      antipsychotic trial for delirium treatment efficacy can be anywhere      from a few days to  several weeks and obviously is affected by what      acute general medical factors are reversible.  [name], M.D.  Electronically Signed     JW/MEDQ  D:  07/02/2007  T:  07/03/2007  Job:  [id number]

## 2011-04-21 NOTE — Consult Note (Signed)
[name], [name]              ACCOUNT NO.:  [id number]   MEDICAL RECORD NO.:  [id number]          PATIENT TYPE:  INP   LOCATION:  2102                         FACILITY:  [facility]   PHYSICIAN:  Dylan Churn. Love, M.D.    DATE OF BIRTH:  Dec 22, 1921   DATE OF CONSULTATION:  06/11/2007  DATE OF DISCHARGE:                                 CONSULTATION   This is one of several [facility] admissions for this 77-year-  old right-handed white married male with a greater than 10-year history  of idiopathic Parkinson's disease, treated with Sinemet 50/200 four  times per day at 7 a.m., 12 noon, 5:30 p.m. and 8:30 p.m. Over the past  week he has had recurrent nausea; in the last 36 hours, vomiting.  He  has developed a high fever and came to the emergency room with a  temperature of 104 degrees.  He had normal white blood cell count at  9900, platelet count that was low at 94000 and normal electrolytes  except for creatinine was 1.97, glucose was 186.  CT scan of the brain  showed no definite abnormalities and a urinalysis was unremarkable.  Chest x-ray did not show any evidence of pneumonia.  Family denies any  history of tick exposure.  There have been no symptoms of aspiration.  He has had no head or neck trauma.  He has had draining wounds on the  shins of his legs and has complained of some left hip and lower back  pain over the last few days.  He has not had any rash.   PAST MEDICAL HISTORY:  His past medical history is significant for  Parkinson's disease, diabetes mellitus, four-vessel coronary bypass  surgery and hyperlipidemia.   He does not smoke cigarettes.  He does not drink any alcohol.   MEDICATIONS:  1. Sinemet 50/200 CR q.i.d.  2. Avandia 4 mg b.i.d.  3. Tranxene 3.75 mg b.i.d.  4. Toprol 25 mg b.i.d.  5. Zocor 25 mg daily.  6. Aspirin 81 mg daily.  7. Flomax 0.4 mg daily.  8. Niaspan 500 mg q.h.s.  9. Lasix 20 mg daily.  10.KCl 10 mEq daily.  11.Prandin 0.5 mg  b.i.d.  12.Metformin 500 mg b.i.d.  13.Plavix 75 mg daily.  14.Lisinopril 10 mg daily.  He has not received his Sinemet today and he has not had his Plavix  today.   PHYSICAL EXAMINATION:  VITAL SIGNS:  Examination revealed a well-  developed white male with a temperature of 103.8 degrees, sitting blood  pressure right arm 110/80, left arm 120/88, heart rate is 100 and  regular.  NECK:  His neck was not stiff, but there was resistance.  He had a  negative Kernig's.  He had a negative Brudzinski's.  MENTAL STATUS:  His eyes were closed, but he would answer questions.  He  was oriented to person, to place, but not to year or to month.  He would  open his eyes and count fingers, but he was not always correct in giving  his answers.  NEUROLOGIC:  His cranial nerve examination revealed  both disks were seen  and flat.  Pupils were reactive at 4 to 3.  His extraocular movements  were full.  He grimaced bilaterally.  He had a no head tremor.  He did  have gags.  He had pinprick felt in his face and grimaced equally  bilaterally.  He had marked increased tone of his upper and lower  extremities with resting tremor.  He had distal weakness in his feet  bilaterally.  He had decreased pinprick in his lower extremities.  He  had absent ankle reflexes, downgoing plantar responses.   IMPRESSION:  1. Metabolic or toxic encephalopathy, code 248.3.  2. High fever, rule out infection, doubt a neuroleptic malignant      syndrome with discontinuation of Sinemet, since he has only      administered it for about 24 hours.  3. Diabetes mellitus, code 250.60.  4. Diabetic peripheral neuropathy, code 257.2.  5. Coronary artery disease, status post four-vessel coronary bypass      surgery, code 429.2.   PLAN:  1. Add doxycycline to his Zosyn and vancomycin therapy.  2. Obtain a spinal tap to rule out the possibility of a primary CNS      infection.  It is unlikely that he has spinal epidural abscess,  in      view of the fact that I went over his back pretty well and did not      get any definite pain.           ______________________________  [name], M.D.     JML/MEDQ  D:  06/11/2007  T:  06/11/2007  Job:  [id number]   cc:   [facility], E Team

## 2011-04-21 NOTE — Consult Note (Signed)
[name], [name]              ACCOUNT NO.:  [id number]   MEDICAL RECORD NO.:  [id number]          PATIENT TYPE:  IPS   LOCATION:  4002                         FACILITY:  [facility]   PHYSICIAN:  Dyke Maes, M.D.DATE OF BIRTH:  August 17, 1922   DATE OF CONSULTATION:  07/06/2007  DATE OF DISCHARGE:                                 CONSULTATION   REFERRING PHYSICIAN:  Ranelle Oyster, M.D.   REASON FOR CONSULTATION:  Acute renal insufficiency.   HISTORY OF PRESENT ILLNESS:  This is an 77 year old white male who was  admitted to [facility] on June 11, 2007 for an MSSA bacteremia.  He has no known history of renal insufficiency, and according to his  primary doctor's records his serum creatinine was 1.3 on March 15, 2007.  On admission to the hospital, serum creatinine was 3.1; however, with  treatment of his bacteremia serum creatinine improved to 1.5 on June 29, 2007.  Since then his serum creatinine has slowly increased up to 2.4 as  of today.  On July 01, 2007 he was transferred to rehab.  Over the last  4 days he has been greater than 10 liters negative fluid balance.  Renal  ultrasound done this hospitalization showed right kidney of 12.5 cm,  left kidney of 12.2 cm with no hydronephrosis, though there was some  mild parenchymal thinning.  He has had diabetes for 12 years and  hypertension for 6 years.   PAST MEDICAL HISTORY:  Significant for:  1. Coronary artery disease, status post CABG x4.  2. Diabetes x12 years.  3. Hypertension x6 years.  4. History of Parkinson's disease.  5. Hyperlipidemia.  6. History of BPH.   ALLERGIES:  NO TRUE ALLERGIES, ALTHOUGH HE HAS ADVERSE EFFECTS FROM  TYLENOL.   MEDICATIONS:  1. Risperdal 0.25 mg daily.  2. Glipizide 10 mg a day.  3. Sinemet 25/100 mg daily.  4. Lopressor 25 mg b.i.d.  5. Zaroxolyn 25 mg a day.  6. Ocuvite one a day.  7. Ferrous sulfate 325 mg b.i.d.  8. Protonix 40 mg b.i.d.  9. Lasix 40 mg b.i.d.  10.Potassium chloride 40 mEq b.i.d.  11.Ancef 2 grams q.12.   SOCIAL HISTORY:  Nonsmoker, nondrinker.  He was living with his wife in  [place] prior to admission.   FAMILY HISTORY:  Negative for renal disease.   REVIEW OF SYSTEMS:  Appetite has been excellent.  Energy level was  improving with rehab.  Denies any shortness of breath, PND, orthopnea.  Denies any chest pains or chest pressures.  No recent change in bowel  habits.  No dysuria.  He had significant peripheral edema although over  the last 4-5 days has improved remarkably.  He denies any new skin  rashes.  No new arthritic complaints.  No new neuropathic symptoms.   PHYSICAL EXAMINATION:  Blood pressure 173/69, pulse 71, temperature 97.  GENERAL:  An elderly 77 year old white male in no acute distress.  HEENT:  Sclerae nonicteric.  Extraocular muscles are intact.  NECK:  Reveals no JVD.  LUNGS:  Few faint bibasilar crackles.  HEART:  Regular rate and rhythm without murmur or gallop.  ABDOMEN:  Positive bowel sounds, nontender, nondistended.  No  hepatosplenomegaly.  EXTREMITIES:  Trace edema.  Pulses are 2/4 in the  carotids, radials, femorals and dorsalis pedis, and 1/4 in posterior  tibial.  SKIN:  No evidence for cholesterol emboli.  No skin rashes.  NEUROLOGIC EXAM:  Cranial nerves are intact.  Motor and sensory intact.  He is oriented x3.  No asterixis.   LABORATORY:  Sodium 138, potassium 3.7, bicarb 35, BUN 32, creatinine  2.4, hemoglobin 9.6, bilirubin 2, with normal liver function tests.   IMPRESSION:  Acute renal insufficiency that I suspect was initially  secondary to sepsis and currently now with an increased serum creatinine  that I suspect is most likely secondary to diuresis as he has over 10  liters negative fluid balance over the last 4 days.  He is on Ancef for  and there is certainly the possibility of interstitial nephritis though  he has no rash, no eosinophilia.   PLAN:  1. Will discontinue  Lasix and Zaroxolyn for now.  2. Will empirically decrease his potassium chloride to one a day.  3. Will check urinalysis.  Will check SPEP.  Will check iron studies.      Will repeat a CMP in the morning.  If his urinalysis shows      significant white cells, then will check urine eosinophil count.   Thank you very much for the consult.  I will follow the patient with  you.           ______________________________  Dyke Maes, M.D.     MTM/MEDQ  D:  07/06/2007  T:  07/07/2007  Job:  [id number]

## 2011-04-21 NOTE — Discharge Summary (Signed)
[name], [name]              ACCOUNT NO.:  [id number]   MEDICAL RECORD NO.:  [id number]          PATIENT TYPE:  INP   LOCATION:  6739                         FACILITY:  [facility]   PHYSICIAN:  Altha Harm, MDDATE OF BIRTH:  1922/01/06   DATE OF ADMISSION:  06/11/2007  DATE OF DISCHARGE:                               DISCHARGE SUMMARY   INTERIM DISCHARGE SUMMARY:  Expected date of discharge is not yet  determined.   Please refer to discharge summary from June 21, 2007 by Dr. Olena Leatherwood for  details of hospital course up until June 21, 2007.   ADDITIONAL DIAGNOSES:  1. Anemia.  2. Suspected myelodysplastic syndrome.  3. Pulmonary edema.   PROCEDURES:  1. Transfusion of 4 units of packed red blood cells.  2. Esophagogastroduodenoscopy, which showed mild gastritis.  3. Flexible sigmoidoscopy, which shows sigmoid diverticula, but no      evidence of bleeding.   CONSULTANTS:  Dr. Jeani Hawking, Gastroenterology.   DIAGNOSTIC STUDIES:  1. VQ scan that shows low probability for pulmonary embolus.  2. Chest x-ray 2-view, which shows patchy air space disease most      consistent with edema.   HOSPITAL COURSE:  1. Anemia:  The patient had a drop in his hemoglobin down to nadir of      7.7.  The patient received transfusion of a total of 4 units of      packed red blood cells and to date his hemoglobin is only 8.9.  The      patient appears to have somewhat of a pancytopenia, which may point      to myelodysplastic syndrome in the absence of any findings of a      bleed.  The gastroenterologist doubt that this anemia is a result      of loss of blood, even though the patient was guaiac positive on      one finding, he has remained guaiac negative subsequent to that.      Presently, the patient has received transfusions.  Thus, indices at      this time would not be reflective of the patient's native state of      his blood and bone marrow.  This patient may have to undergo a  bone      marrow biopsy to grasp the diagnosis causing his low hemoglobin.      For final investigation, the patient will undergo CT of the abdomen      with oral contrast to evaluate for any evidence of retroperitoneal      bleeding.  The patient shows no clinical signs of this, however it      is quite possible that the patient could be having a bleed that is      unrecognized clinically at this time.  2. Renal insufficiency:  The patient continues to have renal      insufficiency, and is at his baseline creatinine of 1.65.  I doubt      that this will improve considerably given the patient's long      history of chronic renal insufficiency.  3.  Pulmonary edema:  The patient had episodes of pulmonary edema      associated with increased volume associated with his transfusions.      The patient has diuresed on a p.r.n. basis.  4. Left wrist pain:  The patient has been complaining of left wrist      pain, and thus will be getting an x-ray of his wrist.  5. Methicillin-sensitive Staphylococcus Aureus:  The patient is on day      #15 out of 30 of cephazolin, and is to continue on that.   Medications have not been determined at this time for discharge.  The  patient is relatively stable, and the patient may benefit from a  Hematology/Oncology consultation if his hemoglobin continues to drop yet  again.  At this time, we will just be monitoring the patient's  hemoglobin on a q.12 hour basis.   Incidentally, please also note that the patient's swallowing mechanism  was evaluated by Speech Therapy, and the patient was found to have no  evidence of difficulty with mastication or swallowing from a bedside  observation.  The patient's diet is upgraded to mechanical soft diet.   In terms of his diabetes, the patient has had one hypoglycemic event,  and this occurred when he was NPO for his procedure today.  Otherwise,  the patient has been euglycemic throughout his hospital stay.       Altha Harm, MD  Electronically Signed     MAM/MEDQ  D:  06/28/2007  T:  06/29/2007  Job:  [id number]   cc:   Marjory Lies, M.D.

## 2011-04-24 NOTE — Op Note (Signed)
[place]  Patient:    [name], [name]                       MRN: [id number] Proc. Date: 04/01/00 Adm. Date:  [id number] Attending:  Charlett Lango CC:         Richard A. Alanda Amass, M.D.             Delorse Lek, M.D.                           Operative Report  PREOPERATIVE DIAGNOSIS:  New onset unstable angina and 3-vessel coronary disease.  POSTOPERATIVE DIAGNOSIS:  New onset unstable angina and 3-vessel coronary disease.  PROCEDURE:  Median sternotomy, coronary artery bypass grafting x 4 (left internal mammary artery to left anterior descending, saphenous vein graft to first diagonal, saphenous vein graft to second obtuse marginal, saphenous vein graft to distal right coronary artery).  SURGEON:  Salvatore Decent. Dorris Fetch, M.D.  ASSISTANT:  Eugenia Pancoast, P.A.  ANESTHESIA:  General.  FINDINGS:  PD and PL too small to graft directly.  Distal right coronary good target beyond tight stenosis.  OM2 good target.  OM1 too small to graft. First diagonal good target.  LAD excellent target.  Good quality conduits.  CLINICAL NOTE:  Mr. Joswick is a 77 year old gentleman, who presented with new onset unstable angina.  He ruled out for myocardial infarction, but underwent cardiac catheterization, which revealed severe 3-vessel coronary disease.  The patient was referred for coronary artery bypass grafting.  The indications, risks, benefits and alternatives were discussed in detail with the patient and are outlined in the patients chart.  He understood these risks and benefits and agreed to proceed.  OPERATIVE NOTE:  Mr. Chatmon was brought to the preoperative holding area on April 01, 2000 and lines were placed to monitor arterial, central venous  and pulmonary arterial pressure.  EKG leads were placed for continuous telemetry. The patient was taken to the operating room, anesthetized and intubated.  A Foley catheter was placed,  intravenous antibiotics were administered.  The chest, abdomen and legs were prepped and draped in the usual fashion.  A median sternotomy was performed and left internal mammary artery was harvested in the standard fashion.  Simultaneously, an incision was made in the medial aspect of the right leg and the greater saphenous vein was harvested from the ankle to the mid thigh.  Both the saphenous vein and the mammary artery were good quality conduits.  The patient was fully heparinized prior dividing the distal end of the mammary artery.  There was good flow through the cut end of the vessel.  A soft bulldog clamp was placed across the distal end of the mammary artery.  All side branches were clipped.  The mammary was placed in a papaverine soaked sponge and placed into the left pleural space.  The pericardium was opened.  The ascending aorta was palpated.  There was no palpable atherosclerotic disease.  The aorta was normal to inspection.  An aortic cannula was placed via concentric 2-0 Ethibond pledgeted purse-string sutures.  The dual stage venous cannula was placed via purse-string suture in the right atrial appendage.  Cardiopulmonary bypass was instituted and the patient was cooled to 32 degrees Celcius.  The coronary arteries were inspected and anastomotic sites were chosen.  The conduits were inspected and cut to length.  A foam pad was placed in the pericardium to  protect the left phrenic nerve.  A temperature probe was placed in the myocardial septum and a cardioplegia cannula was placed in the ascending aorta.  As noted in the findings, the posterior descending and posterolateral branches were too small to be grafted sequentially.  Therefore, a single graft was placed into the distal right coronary beyond the 90% stenosis.  The aorta was cross-clamped.  The left ventricle was emptied via the aortic root vent.  Cardiac arrest then was achieved with a combination of  cold antegrade cardioplegia and topical ice saline.  After achieving a complete diastolic arrest and myocardial septal temperature of 11 degrees Celcius, the following distal anastomoses were performed.  First, a reverse saphenous vein graft was placed end-to-side to the distal right coronary artery.  This was a 2 mm, good quality target.  There was a tight stenosis proximally and no significant disease distally.  The anastomosis was performed with a running 7-0 Prolene suture.  Additional cardioplegia was administered down the anastomosis and there was good hemostasis.  Next, a reverse saphenous vein graft was placed end-to-side to the second obtuse marginal.  As noted, the first obtuse marginal was less than 1 mm and too small to graft.  The second obtuse marginal was a 1.8 mm, good quality coronary.  The anastomosis was performed with a running 7-0 Prolene suture in an end-to-side fashion.  The saphenous vein graft was also of good quality. There was excellent flow through the graft and additional cardioplegia was administered down the vein graft.  There was good hemostasis. Next, a reverse saphenous vein graft was placed end-to-side to the second diagonal branch of the LAD.  There was a small ungraftable first diagonal. The second diagonal was a good sized vessel, which ran anterolateral course. It was 1.5 mm in diameter.  The anastomosis was performed in an end-to-side fashion and once again there was good hemostasis with administration of cardioplegia.  The left internal mammary artery was brought through a window in the pericardium anteriorly.  The distal end of the mammary was spatulated and the accompanying veins were clipped.  The LIMA then was sewn end-to-side to the distal LAD, which was a 2 mm, good quality target.  The mammary artery was a 2 mm, good quality conduit.  The anastomosis was performed in an end-to-side fashion using a running 8-0 Prolene suture.  At the  completion of the mammary to LAD anastomosis, the bulldog clamp was removed from the mammary artery.  Immediate and rapid septal rewarming was noted.  There was good hemostasis at the anastomosis.  The mammary pedicle was tacked to the epicardial surface of the heart with interrupted 6-0 Prolene sutures. Lidocaine was administered. The aortic cross-clamp was removed.  Total cross-clamp time was 47 minutes.  A single defibrillation with 20 joules was required and patient then was in sinus rhythm throughout the remainder of the procedure.  A partial occlusion clamp was placed on the ascending aorta.  The vein grafts were cut to length.  The cardioplegia cannula was removed, proximal vein graft anastomoses then were performed to 4.4 mm punch aortotomies with running 6-0 Prolene sutures.  At the completion of the final proximal anastomosis, the patient was placed in Trendelenburg position, air was allowed to vent as the partial clamp was removed.  Air was then aspirated from the vein grafts, the bulldog clamps were removed and flow was restored.   All proximal and distal anastomoses were inspected for hemostasis.  Epicardial pacing wires were placed on  the right ventricle and right atrium.  The patient was weaned from cardiopulmonary bypass when he had been rewarmed to a core temperature of 37 degrees Celcius.  The patient weaned from bypass without difficulty and had good initial cardiac index.  The patient then was noted to have bleeding coming from posteriorly.  Inspection revealed a small tear in the tip of the left atrial appendage and an attempt was made to repair this without going back on bypass.  However, the atrial tissue was fragile and additional tear was made in the atrium.  Therefore, the atrial cannulae was reinserted and the patient was placed back on cardiopulmonary bypass.  The left atrial appendage was oversewn with a 4-0 pledgeted Prolene suture.  There was good  hemostasis. Patient was kept warm throughout this time period and then was weaned again from cardiopulmonary bypass without difficulty.  Initial bypass time was 111 minutes, secondary bypass time 10 minutes, total 121 minutes.  After weaning from bypass the second time, the patient again had good cardiac function with a cardiac index greater than 2 L/min.  The patient remained hemodynamically stable throughout the postbypass period.  A test dose of protamine was administered.  The atrial and aortic cannulae were removed. There was good hemostasis at both cannulation sites.  The remainder of the protamine was administered without incident.  The chest was irrigated with 1 L of warm normal saline containing 1 g of vancomycin.  Hemostasis was achieved. The pericardium was reapproximated over the base of the heart and the ascending aorta with interrupted 3-0 silk sutures.  A left pleural and two mediastinal chest tubes were placed through separate subcostal incisions.  The sternum was closed with interrupted stainless steel wires.  The pectoralis fascia was closed with a running #1 Vicryl suture.  The subcutaneous tissue was closed a running 2-0 Vicryl suture and the skin was closed with a 3-0 Vicryl subcuticular suture.  All sponge, needle and instrument counts were correct at the end of the procedure.  The patient remained hemodynamically stable throughout the postbypass period and was taken from the operating room to the surgical intensive care unit intubated and in stable condition. DD:  04/01/00 TD:  04/01/00 Job: [id number]

## 2011-04-24 NOTE — Op Note (Signed)
NAME:  [name], [name]                        ACCOUNT NO.:  [id number]   MEDICAL RECORD NO.:  [id number]                   PATIENT TYPE:  AMB   LOCATION:  DAY                                  FACILITY:  [facility]   PHYSICIAN:  Timothy E. Earlene Plater, M.D.              DATE OF BIRTH:  October 02, 1922   DATE OF PROCEDURE:  12/03/2003  DATE OF DISCHARGE:                                 OPERATIVE REPORT   PREOPERATIVE DIAGNOSIS:  Internal and external hemorrhoids, with rectal  mucosal prolapse.   POSTOPERATIVE DIAGNOSIS:  Internal and external hemorrhoids, with rectal  mucosal prolapse.   PROCEDURE:  Extensive hemorrhoidectomy with PPH anopexy and excision of  external hemorrhoids and band ligation.   SURGEON:  Timothy E. Earlene Plater, M.D.   ANESTHESIA:  General.   INDICATIONS:  [name] is 77 years of age.  He has had hemorrhoids for  many years, and they have progressed now to the point of constant  protrusion, constant soilage, bleeding each and every day, borderline  anemia.  Because of these issues he wants to proceed with surgery.  This has  been carefully discussed, particularly in light of his age and accompanying  features of organic heart disease, hypertension, diabetes mellitus, BPH.  He  understands the expected outcomes and the potential consequences.   The patient was seen and evaluated by anesthesia.  Laboratory data was  reviewed.  He was identified and the permit signed.   DESCRIPTION OF PROCEDURE:  The patient was taken to the operating room,  placed supine.  LMA anesthesia provided.  He was carefully placed in the  lithotomy position.  Perianal area was inspected, prepped and draped in the  usual fashion.  Large polypoid external hemorrhoidal tags were present, as  well rectal mucosal prolapse.  The mucosal prolapse was reduced.  The  perianal area was prepped and draped in the usual fashion, and Marcaine  0.25% with epinephrine was mixed 50:1 with Wydase; 30 cc were injected  round  about the anal orifice for a wide perianal block.  The operating anoscope  introduced.  Rectal mucosal prolapse reduced and a suture of 2-0 Prolene was  placed in a circumferential fashion (mucosa only), completely around the  rectal mucosa approximately 4 cm from the dentate line.  This was secured,  tested.  The scope was removed.  The scope with the PPH device was inserted.  The sutures were drawn through the center, and then the wide open PPH  stapler was introduced with the head snugly placed above the pursestring  suture, and the pursestring sutured around the shaft of the stapling device.  Then, with proper alignment and angulation, the PPH stapling device was  closed, held for 60 sec, fired, held for 60 sec and then removed.  A large  cuff of rectal mucosa was removed and it was intact a complete 360 degrees.  Gauzes were packed into the rectum.  Then attention was turned to the external disease.  Four large polypoid tags  were removed in approximately the 3 o'clock, 5 o'clock, 7 o'clock and 11  o'clock positions.  Each of these wounds were closed with a running chromic.  There was a large right anterior internal component just at the anal verge,  that continued to prolapse and bleed.  Therefore I placed a band ligation on  that single site.  This controlled the bleeding.  All areas were carefully  checked for bleeding over a period of 15 min; no bleeding was seen.  Two  Gelfoam gauzes were placed in the rectum and one externally; then a dry  sterile dressing.   The patient had tolerated it well, was stable with anesthesia.  A Foley  catheter was inserted under sterile conditions and then he was awakened and  taken to the recovery room, where he will then be admitted.                                               Timothy E. Earlene Plater, M.D.    TED/MEDQ  D:  12/03/2003  T:  12/03/2003  Job:  [id number]   cc:   Gerlene Burdock A. Alanda Amass, M.D.  [phone] [address]   [place]  [place] [address]  Fax: [phone]   Marjory Lies, M.D.  [address]  [place]  [place] [address]  Fax: [phone]

## 2011-04-24 NOTE — Cardiovascular Report (Signed)
[place]  Patient:    [name], [name]                       MRN: [id number] Proc. Date: 03/29/00 Adm. Date:  [id number] Attending:  Ruta Hinds CC:         Delorse Lek, M.D.                        Cardiac Catheterization  PROCEDURES: 1. Left heart catheterization. 2. Coronary angiography. 3. Left ventriculogram. 4. Left subclavian angiography.  COMPLICATIONS:  None.  INDICATIONS:  [name] is a 77 year old white male with a history of insulin-dependent diabetes mellitus, but no known history of a CAD.  The patient presented to the ER on March 28, 2000, complaining of substernal chest pain on nd off for the last week.  The pain was occurring with both exertion and with rest. He had no known history of CAD.  He had no ECG changes in the ER with negative Ks. He did have migration of pain into both arms with associated shortness of breath. He is now referred for cardiac catheterization.  DESCRIPTION OF PROCEDURE:  After given informed written consent, the patient was brought to the cardiac catheterization lab where his right and left groins were  shaved, prepped, and draped in the usual sterile fashion.  ECG monitoring was established.  Using modified Seldinger technique, a #6 French arterial sheath was inserted into the right femoral artery.  A 6 French diagnostic catheter was then used to perform diagnostic angiography.  This revealed a medium sized left main  with no significant disease.  The LAD is a medium sized vessel which coursed to the apex and gave rise to two  diagonal branches.  The LAD is noted to be irregular through its midportion with up to 50% stenotic lesion.  The first diagonal was a small vessel which is a 99% ostial lesion and an 80% mid vessel lesion.  The second diagonal is a medium sized vessel which bifurcates distally and had a 60% ostial stenosis.  The AV groove circumflex is a large  vessel which coursed in the AV groove and gave rise to two obtuse marginal branches.  The AV groove circumflex has a 90% proximal stenosis. The first OM is a large vessel which bifurcates distally and has a 70% proximal, 50% mid, and then divides in its midportion.  The ostium of the upper branch has a 95% stenotic lesion and the ostium in the lower branch has a 99% stenotic lesion. The second OM is a medium sized vessel with no significant disease.  The right coronary artery is a large vessel which is dominant and gives rise to  both the PDA as well as the posterolateral branch.  The RCA has a 40% proximal and 70% distal stenotic lesion.  The PDA is a medium sized vessel with no significant disease.  The PLA is a medium sized vessel with no significant disease.  LEFT VENTRICULOGRAM:  The left ventriculogram reveals preserved EF of approximately 50-60%.  There is significant ectopy.  I am unable to access MR secondary to ectopy.  Left internal mammary artery is patent and ungrafted.  HEMODYNAMICS:  Systemic arterial pressure 144/70, LV systemic pressure 144/18, LVEDP of 22.  CONCLUSIONS: 1. Significant three-vessel coronary artery disease. 2. Normal left ventricular systolic function. 3. Patent and ungrafted left internal mammary artery. DD:  03/29/00 TD:  03/29/00 Job: [id number]  [id number]

## 2011-04-24 NOTE — H&P (Signed)
[place]  Patient:    [name], [name]                       MRN: [id number] Adm. Date:  [id number] Attending:  Ruta Hinds CC:         Marjory Lies, M.D., [place], [place]             Richard A. Alanda Amass, M.D.                         History and Physical  DATE OF BIRTH: 1922/06/05  CHIEF COMPLAINT: Chest pain.  HISTORY OF PRESENT ILLNESS: The patient is a 76 year old man, the husband of a patient of Dr. Mancel Parsons, and himself a primary care patient of Dr. Marjory Lies in [place], [place], presenting with a chief complaint of chest pain. He said he first felt this about ten days ago.  He has had recurrent episodes of pain starting at both elbows and radiating to his back and then to his chest, pain which is increased with exertion, associated with mild dyspnea, and relieved by  rest and taking deep breaths in about five to ten minutes.  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  PAST SURGICAL HISTORY: Left inguinal hernia repair two year ago by Dr. Earlene Plater. e was exempt from [employer] service in [employer] War II because of a weak left eye. He has had no other surgical operation.  PAST MEDICAL HISTORY: He has been treated for non-insulin dependent diabetes for about 12 years, for Parkinsons disease, and for hypertension.  FAMILY HISTORY: His mother at age 75, his father at age 76, and his brother at ge 61 all died of heart attacks.  His surviving sister is alive and well at age 79.  SOCIAL HISTORY: He quit smoking in 1951.  He works at a [employer] of the [employer].  He has been married for 53 years.  He has five children, all of whom are grown and all of whom are alive and well.  REVIEW OF SYSTEMS: CONSTITUTIONAL: He denies fever, chills, or sweats.  He says  that he does have some cramps in his legs when he  walks and that that has been going on for about the past year.  EYES: He says that his vision is failing. He has bilateral cataracts.  He wears glasses.  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  He denies difficulty swallowing.  CARDIOVASCULAR: See History of Present Illness.  RESPIRATORY: He denies cough with Altace or wheezing.  Smoking history as noted.  GASTROINTESTINAL: No dysphagia, nausea, vomiting, or change in bowel habits.  GENITOURINARY: No dysuria or pyuria.  He does have nocturia of variable frequency, most recently up about five times last night after drinking tea before bedtime.  MUSCULOSKELETAL: No swollen joints or myalgia.  SKIN/BREAST: No rash r nodules except for a very large skin tag on his right shoulder.  NEUROLOGIC: No  faintness or syncope or paresthesias.  ENDOCRINE: He has diabetes as noted above. No known thyroid disease.  He notes no swelling in the neck, axilla, or groin.  ALLERGIES: ANTIHISTAMINES cause hallucinations.  CURRENT MEDICATIONS:  1. Amaryl 8 mg q.d.  2. Avandia 8 mg q.d.  3. Cardura 8 mg q.d.  4. Sustained release Inderal 120 mg q.d.  5. Carbidopa/levodopa 10/100,  3 tablets t.i.d. (total of nine tablets a day).  6. Altace 2.5 mg q.d.  7. Tranxene 3.75 mg b.i.d.  PHYSICAL EXAMINATION:  VITAL SIGNS: Temperature 98.2 degrees, blood pressure 150/80, pulse 80, respirations 18.  GENERAL: Well-developed, well-nourished elderly man, now in no acute distress. He is oriented to person, place, and time.  His mood and affect are appropriate.  HEENT:  Conjunctivae and sclerae reveal no xanthelasma or icterus.  His teeth are his own, about half in number, and in fair repair.  Oral mucosa reveals no pallor or cyanosis.  NECK: Supple and symmetrical.  The trachea is midline and mobile.  There is no palpable thyromegaly or cervical node.  No carotid bruit or JVD.  RESPIRATORY: His respiratory effort is normal at rest.  His lungs are clear  at rest.  There is moderate prolongation of forced expiratory time, with some wheezing.  CARDIOVASCULAR: Cardiac apical impulse is cryptic.  Left border of cardiac dullness is at the left anterior axillary line.  There is no -.  The heart rhythm is regular and the rate is normal.  There is a soft systolic murmur at the left upper sternal border.  BACK: Straight and nontender.  No curvature.  EXTREMITIES: The digits and nails revealed no clubbing or cyanosis.  Pedal pulses are palpable.  Dorsalis pedis and posterior tibial palpable in both feet. Ankles reveal trace edema.  SKIN:  The skin and subcutaneous tissue revealed a fair amount of hyperpigmented skin over the shins, consistent with diabetes.  ABDOMEN: Mildly to moderately obese, without mass or tenderness.  Bowel sounds present. No palpable enlarged liver or spleen and no palpable enlargement of the abdominal aorta and no bruit.  Femoral arteries without bruits.  NEUROLOGIC: Gait not tested.  Muscle strength and tone are normal.  He does have a very slight tremor of the cogwheel variety; his wife says it was much worse before he started taking medication.  LABORATORY DATA: EKG shows sinus rhythm with left axis deviation, and widespread 1/2 mm ST segment depression.  Chest x-ray shows mild cardiomegaly and clear lungs.  IMPRESSION: This 77 year old man, long-term diabetic and hypertensive, presents  with a history of classic angina.  He has minor changes on his electrocardiogram. He will be placed on a nitroglycerin drip and Lovenox and referred for cardiac catheterization.  He is not currently taking any diabetic medications which would be a contraindication to cardiac catheterization.DD:  03/28/00 TD:  03/29/00 Job: [id number]

## 2011-04-24 NOTE — Discharge Summary (Signed)
[place]. [facility]  Patient:    [name], [name]                       MRN: [id number] Adm. Date:  [id number] Disc. Date: [id number] Attending:  Charlett Lango Dictator:   Lissa Merlin, P.A. CC:         Richard A. Alanda Amass, M.D.             Delorse Lek, M.D.             Salvatore Decent Dorris Fetch, M.D.                           Discharge Summary  DATE OF BIRTH:  06/30/1922  SURGEON:  Dr. Dorris Fetch.  CARDIOLOGIST:  Dr. Alanda Amass.  PRIMARY MEDICAL DOCTOR:  Dr. Doristine Counter.  ADMISSION DIAGNOSIS:  Unstable angina.  PAST MEDICAL HISTORY: 1. The patient has no previous history of CAD. 2. Positive history of non-insulin-dependent diabetes mellitus. 3. Hypertension. 4. Asthma. 5. Parkinsons. 6. Benign prostatic hypertrophy.  DISCHARGE DIAGNOSES: 1. Severe three vessel coronary artery disease with preserved left ventricular    function as per cardiac catheterization. 2. Status post coronary artery bypass grafting x 4. 3. Postoperative anemia with transfusions. 4. Hyperlipidemia, placed on statin drug.  PROCEDURES: 1. Cardiac catheterization on March 29, 2000.  Catheterization revealed    significant three vessel CAD and preserved left ventricular systolic    function. 2. CABG x 4 on April 01, 2000, with the following grafts:  LIMA to LAD,    saphenous vein graft to first diagonal, saphenous vein graft to OM 2,    saphenous vein graft to distal RCA.  HISTORY OF PRESENT ILLNESS AND HOSPITAL COURSE:  [name] is a pleasant 77 year old white male with no previous CAD history.  He presented on March 29, 2000, with the complaint of chest tightness and left shoulder pain, positive nausea.  Was brought in for cardiac catheterization March 29, 2000. The patient was stabilized with heparin and nitroglycerin.  Cardiac enzymes were negative x 2. There were no acute changes on the cardiogram.  Cardiology was following.  Cardiac catheterization was done  as indicated above.  CVTS consult called for.  Dr. Dorris Fetch evaluated catheterization data and examined [name].  CABG was recommended as best treatment option.  Risks, benefits, details, and alternatives of surgery were discussed with [name]. It was agreed to proceed.  Surgery was tentatively scheduled for April 01, 2000.  The patient remained stable while awaiting surgery.  He underwent the procedure, as planned, on April 01, 2000.  There were no complications.  He was taken to SICU in stable condition.  Later that evening, the patient was noted to be anemic, with thrombocytopenia.  He received FFP and platelets.  He received transfusion for anemia.  On postoperative day #1, he was noted to be doing well.  Diuresis continued for volume overload.  Hematocrit had improved. He was hemodynamically stable.  He was in sinus rhythm.  Room air saturations were 95%.  On postoperative day #2, [name] was noted to be doing very well.  He had some moderate bloody drainage from his pleural tube.  By the next day this had diminished, and the chest tube was discontinued.  On postoperative day #4, [name] was again doing very well, except for postoperative anemia.  His hemoglobin was 8.1.  He received a transfusion of one unit.  By  postoperative day #4, [name] is making good progress.  His vital signs are stable, he is afebrile, his O2 saturations are good.  His weight is coming down considerably.  He is still approximately six pounds over preoperative weight.  His anemia is improving.  Hemoglobin is 9.5 today.  He is in sinus rhythm.  He is taking p.o. well.  His bowel and bladder are well. He is walking with cardiac rehabilitation.  His wounds are healing well.  A lipid panel was checked during his stay.  He was found to have hyperlipidemia. He was put on Zocor 40 mg p.o. q.h.s.  [name] is doing very well, and it is anticipated that he will be discharged home pending satisfactory  morning rounds on Wednesday, Apr 07, 2000.  MEDICATIONS:  1. Altace 2.5 mg 1 p.o. q.d.  2. Lopressor 50 mg, 1/2 tablet p.o. q.12h.  3. Lasix 40 mg tablet, 1 p.o. q.d. x 7 days.  4. KCl 20 mEq 1 p.o. q.d. x 7 d.  5. Coated aspirin 325 mg 1 p.o. q.d.  6. Zocor 40 mg 1 p.o. q.h.s.  7. Amaryl 4 mg tablets, 2 tablets p.o. q.d.  8. Avandia 8 mg 1 p.o. q.d.  9. Tylox 1-2 p.o. q.4-6h. p.r.n. for pain. 10. The patient will resume Tranxene as before admission. 11. He will also resume Sinemet 10/100 as before admission.  ALLERGIES:  The patient relates allergy to ANTIHISTAMINES.  SPECIAL INSTRUCTIONS:  The patient is instructed to avoid strenuous activity. No heavy lifting more than 10 pounds.  He is told to do no driving.  He is instructed to use his incentive spirometer daily.  He is told to maintain a diabetic diet as before.  He is told to keep his wounds clean and dry, and to use soap and water only.  He is to call the office if he notices anything unusual with regard to his wounds, or if he has any questions whatsoever.  He is told to get a chest x-ray when he sees Dr. Alanda Amass for a two-week follow-up.  FOLLOW-UP: 1. The patient is to see Dr. Alanda Amass in two weeks.  He is to call and    arrange this appointment. 2. The patient will see Dr. Dorris Fetch on Wednesday, Apr 28, 2000, at    10:30 a.m. DD:  04/06/00 TD:  04/08/00 Job: [id number]

## 2011-09-21 LAB — RENAL FUNCTION PANEL
Albumin: 2 — ABNORMAL LOW
BUN: 33 — ABNORMAL HIGH
CO2: 28
CO2: 28
Calcium: 7.8 — ABNORMAL LOW
Calcium: 8.3 — ABNORMAL LOW
Chloride: 103
Chloride: 98
Creatinine, Ser: 1.9 — ABNORMAL HIGH
Creatinine, Ser: 2.48 — ABNORMAL HIGH
GFR calc Af Amer: 30 — ABNORMAL LOW
GFR calc Af Amer: 41 — ABNORMAL LOW
GFR calc non Af Amer: 25 — ABNORMAL LOW
GFR calc non Af Amer: 34 — ABNORMAL LOW
Sodium: 139

## 2011-09-21 LAB — BASIC METABOLIC PANEL
BUN: 39 — ABNORMAL HIGH
BUN: 18
BUN: 19
BUN: 20
BUN: 29 — ABNORMAL HIGH
CO2: 25
CO2: 31
CO2: 35 — ABNORMAL HIGH
Calcium: 8 — ABNORMAL LOW
Calcium: 7.5 — ABNORMAL LOW
Calcium: 7.6 — ABNORMAL LOW
Calcium: 7.6 — ABNORMAL LOW
Calcium: 7.7 — ABNORMAL LOW
Calcium: 7.7 — ABNORMAL LOW
Calcium: 7.8 — ABNORMAL LOW
Calcium: 7.9 — ABNORMAL LOW
Calcium: 8.4
Calcium: 8.4
Calcium: 8.5
Chloride: 101
Chloride: 110
Chloride: 93 — ABNORMAL LOW
Chloride: 94 — ABNORMAL LOW
Creatinine, Ser: 2.43 — ABNORMAL HIGH
Creatinine, Ser: 1.65 — ABNORMAL HIGH
Creatinine, Ser: 1.72 — ABNORMAL HIGH
Creatinine, Ser: 1.75 — ABNORMAL HIGH
Creatinine, Ser: 1.85 — ABNORMAL HIGH
Creatinine, Ser: 1.91 — ABNORMAL HIGH
Creatinine, Ser: 2.12 — ABNORMAL HIGH
Creatinine, Ser: 2.17 — ABNORMAL HIGH
Creatinine, Ser: 2.46 — ABNORMAL HIGH
GFR calc Af Amer: 31 — ABNORMAL LOW
GFR calc Af Amer: 31 — ABNORMAL LOW
GFR calc Af Amer: 35 — ABNORMAL LOW
GFR calc Af Amer: 36 — ABNORMAL LOW
GFR calc Af Amer: 39 — ABNORMAL LOW
GFR calc Af Amer: 45 — ABNORMAL LOW
GFR calc Af Amer: 46 — ABNORMAL LOW
GFR calc Af Amer: 49 — ABNORMAL LOW
GFR calc non Af Amer: 27 — ABNORMAL LOW
GFR calc non Af Amer: 29 — ABNORMAL LOW
GFR calc non Af Amer: 30 — ABNORMAL LOW
GFR calc non Af Amer: 33 — ABNORMAL LOW
GFR calc non Af Amer: 34 — ABNORMAL LOW
GFR calc non Af Amer: 38 — ABNORMAL LOW
GFR calc non Af Amer: 38 — ABNORMAL LOW
GFR calc non Af Amer: 38 — ABNORMAL LOW
GFR calc non Af Amer: 40 — ABNORMAL LOW
GFR calc non Af Amer: 43 — ABNORMAL LOW
Glucose, Bld: 112 — ABNORMAL HIGH
Glucose, Bld: 61 — ABNORMAL LOW
Glucose, Bld: 74
Glucose, Bld: 81
Glucose, Bld: 86
Potassium: 3.7
Potassium: 3.9
Potassium: 4
Potassium: 4.1
Sodium: 139
Sodium: 139
Sodium: 140
Sodium: 142
Sodium: 143
Sodium: 144
Sodium: 144
Sodium: 144

## 2011-09-21 LAB — DIFFERENTIAL
Basophils Absolute: 0
Basophils Absolute: 0
Basophils Absolute: 0
Basophils Absolute: 0
Basophils Absolute: 0
Basophils Absolute: 0
Basophils Absolute: 0
Basophils Relative: 1
Basophils Relative: 2 — ABNORMAL HIGH
Basophils Relative: 0
Basophils Relative: 0
Basophils Relative: 1
Eosinophils Absolute: 0.5
Eosinophils Absolute: 0.6
Eosinophils Absolute: 0
Eosinophils Absolute: 0.2
Eosinophils Relative: 8 — ABNORMAL HIGH
Eosinophils Relative: 9 — ABNORMAL HIGH
Eosinophils Relative: 3
Eosinophils Relative: 3
Lymphocytes Relative: 15
Lymphocytes Relative: 17
Lymphocytes Relative: 17
Lymphocytes Relative: 20
Lymphocytes Relative: 20
Lymphs Abs: 0.8
Lymphs Abs: 0.9
Monocytes Absolute: 0.6
Monocytes Absolute: 0.7
Monocytes Absolute: 0.6
Monocytes Relative: 12 — ABNORMAL HIGH
Monocytes Relative: 10
Monocytes Relative: 7
Monocytes Relative: 9
Neutro Abs: 3.3
Neutro Abs: 3.6
Neutro Abs: 3.9
Neutro Abs: 4.3
Neutrophils Relative %: 56
Neutrophils Relative %: 69
Neutrophils Relative %: 70
Neutrophils Relative %: 72

## 2011-09-21 LAB — URINALYSIS, ROUTINE W REFLEX MICROSCOPIC
Bilirubin Urine: NEGATIVE
Ketones, ur: NEGATIVE
Leukocytes, UA: NEGATIVE
Nitrite: NEGATIVE
Protein, ur: 100 — AB
Urobilinogen, UA: 0.2
pH: 8

## 2011-09-21 LAB — URINE CULTURE
Colony Count: >=100000
Special Requests: NEGATIVE

## 2011-09-21 LAB — HEMOGLOBIN AND HEMATOCRIT, BLOOD
HCT: 25.6 — ABNORMAL LOW
HCT: 25.6 — ABNORMAL LOW
HCT: 25.8 — ABNORMAL LOW
HCT: 26.4 — ABNORMAL LOW
HCT: 27 — ABNORMAL LOW
HCT: 27 — ABNORMAL LOW
HCT: 27.2 — ABNORMAL LOW
HCT: 27.3 — ABNORMAL LOW
HCT: 28.3 — ABNORMAL LOW
Hemoglobin: 8.3 — ABNORMAL LOW
Hemoglobin: 8.5 — ABNORMAL LOW
Hemoglobin: 8.6 — ABNORMAL LOW
Hemoglobin: 8.7 — ABNORMAL LOW
Hemoglobin: 8.9 — ABNORMAL LOW
Hemoglobin: 9 — ABNORMAL LOW
Hemoglobin: 9 — ABNORMAL LOW
Hemoglobin: 9.4 — ABNORMAL LOW

## 2011-09-21 LAB — COMPREHENSIVE METABOLIC PANEL
ALT: 8
ALT: <8
AST: 17
AST: 18
CO2: 31
CO2: 33 — ABNORMAL HIGH
Calcium: 8.1 — ABNORMAL LOW
Chloride: 100
Chloride: 97
Creatinine, Ser: 1.56 — ABNORMAL HIGH
Creatinine, Ser: 2.47 — ABNORMAL HIGH
GFR calc Af Amer: 30 — ABNORMAL LOW
GFR calc Af Amer: 52 — ABNORMAL LOW
GFR calc non Af Amer: 25 — ABNORMAL LOW
GFR calc non Af Amer: 43 — ABNORMAL LOW
Glucose, Bld: 114 — ABNORMAL HIGH
Total Bilirubin: 1.5 — ABNORMAL HIGH
Total Bilirubin: 2 — ABNORMAL HIGH

## 2011-09-21 LAB — CBC
HCT: 24.1 — ABNORMAL LOW
HCT: 25.1 — ABNORMAL LOW
HCT: 24.1 — ABNORMAL LOW
HCT: 24.9 — ABNORMAL LOW
HCT: 25.1 — ABNORMAL LOW
HCT: 28.9 — ABNORMAL LOW
Hemoglobin: 8.6 — ABNORMAL LOW
Hemoglobin: 8.3 — ABNORMAL LOW
Hemoglobin: 8.3 — ABNORMAL LOW
Hemoglobin: 9.6 — ABNORMAL LOW
MCHC: 33.8
MCHC: 34.3
MCHC: 33
MCHC: 33.3
MCHC: 33.7
MCV: 87.1
MCV: 88
MCV: 88.1
MCV: 84.5
MCV: 84.9
MCV: 85.4
MCV: 85.7
MCV: 87.1
Platelets: 181
Platelets: 192
Platelets: 151
Platelets: 160
Platelets: 181
Platelets: 213
Platelets: 224
Platelets: 230
Platelets: 236
Platelets: 261
RBC: 2.81 — ABNORMAL LOW
RBC: 2.89 — ABNORMAL LOW
RBC: 2.96 — ABNORMAL LOW
RBC: 3.06 — ABNORMAL LOW
RBC: 3.22 — ABNORMAL LOW
RBC: 3.32 — ABNORMAL LOW
RDW: 18.4 — ABNORMAL HIGH
RDW: 18.4 — ABNORMAL HIGH
RDW: 18.5 — ABNORMAL HIGH
RDW: 19.1 — ABNORMAL HIGH
RDW: 19.4 — ABNORMAL HIGH
RDW: 20.6 — ABNORMAL HIGH
WBC: 5.6
WBC: 5.9
WBC: 10.3
WBC: 12.6 — ABNORMAL HIGH
WBC: 5.2
WBC: 5.3
WBC: 5.4
WBC: 6.7
WBC: 9.5

## 2011-09-21 LAB — B-NATRIURETIC PEPTIDE (CONVERTED LAB)
Pro B Natriuretic peptide (BNP): 603 — ABNORMAL HIGH
Pro B Natriuretic peptide (BNP): 783 — ABNORMAL HIGH

## 2011-09-21 LAB — PROTEIN ELECTROPH W RFLX QUANT IMMUNOGLOBULINS
Albumin ELP: 38.8 — ABNORMAL LOW
Alpha-1-Globulin: 5.3 — ABNORMAL HIGH
Beta 2: 7.2 — ABNORMAL HIGH
Beta Globulin: 5.6
Total Protein ELP: 6.9

## 2011-09-21 LAB — CULTURE, BLOOD (ROUTINE X 2): Culture: NO GROWTH

## 2011-09-21 LAB — TYPE AND SCREEN: Antibody Screen: NEGATIVE

## 2011-09-21 LAB — CROSSMATCH

## 2011-09-21 LAB — IGG, IGA, IGM
IgA: 688 — ABNORMAL HIGH
IgG (Immunoglobin G), Serum: 2210 — ABNORMAL HIGH
IgM, Serum: 57 — ABNORMAL LOW

## 2011-09-21 LAB — BLOOD GAS, ARTERIAL
Acid-Base Excess: 1
Bicarbonate: 25.1 — ABNORMAL HIGH
O2 Content: 1
O2 Saturation: 94.2
TCO2: 26.4
pO2, Arterial: 67 — ABNORMAL LOW

## 2011-09-21 LAB — IRON AND TIBC
Saturation Ratios: 31
UIBC: 140

## 2011-09-21 LAB — RETICULOCYTES: Retic Count, Absolute: 38

## 2011-09-21 LAB — IMMUNOFIXATION ADD-ON

## 2011-09-22 LAB — CSF CELL COUNT WITH DIFFERENTIAL
RBC Count, CSF: 14 — ABNORMAL HIGH
WBC, CSF: 7 — ABNORMAL HIGH

## 2011-09-22 LAB — BASIC METABOLIC PANEL
BUN: 38 — ABNORMAL HIGH
BUN: 43 — ABNORMAL HIGH
BUN: 57 — ABNORMAL HIGH
BUN: 63 — ABNORMAL HIGH
CO2: 15 — ABNORMAL LOW
CO2: 28
CO2: 29
CO2: 29
Calcium: 7.3 — ABNORMAL LOW
Calcium: 7.6 — ABNORMAL LOW
Calcium: 7.8 — ABNORMAL LOW
Calcium: 7.9 — ABNORMAL LOW
Chloride: 107
Chloride: 107
Chloride: 110
Chloride: 111
Creatinine, Ser: 2.22 — ABNORMAL HIGH
Creatinine, Ser: 2.49 — ABNORMAL HIGH
Creatinine, Ser: 2.74 — ABNORMAL HIGH
Creatinine, Ser: 3.05 — ABNORMAL HIGH
Creatinine, Ser: 3.12 — ABNORMAL HIGH
GFR calc Af Amer: 23 — ABNORMAL LOW
GFR calc Af Amer: 24 — ABNORMAL LOW
GFR calc Af Amer: 24 — ABNORMAL LOW
GFR calc Af Amer: 33 — ABNORMAL LOW
GFR calc Af Amer: 34 — ABNORMAL LOW
GFR calc non Af Amer: 20 — ABNORMAL LOW
GFR calc non Af Amer: 20 — ABNORMAL LOW
GFR calc non Af Amer: 20 — ABNORMAL LOW
GFR calc non Af Amer: 22 — ABNORMAL LOW
GFR calc non Af Amer: 23 — ABNORMAL LOW
GFR calc non Af Amer: 29 — ABNORMAL LOW
Glucose, Bld: 106 — ABNORMAL HIGH
Glucose, Bld: 113 — ABNORMAL HIGH
Glucose, Bld: 166 — ABNORMAL HIGH
Glucose, Bld: 188 — ABNORMAL HIGH
Glucose, Bld: 88
Potassium: 2.5 — CL
Potassium: 2.8 — ABNORMAL LOW
Potassium: 3 — ABNORMAL LOW
Potassium: 3.5
Potassium: 3.8
Sodium: 138
Sodium: 141
Sodium: 145
Sodium: 146 — ABNORMAL HIGH
Sodium: 149 — ABNORMAL HIGH

## 2011-09-22 LAB — POCT I-STAT 3, ART BLOOD GAS (G3+)
Acid-base deficit: 11 — ABNORMAL HIGH
Acid-base deficit: 8 — ABNORMAL HIGH
Bicarbonate: 14.7 — ABNORMAL LOW
Operator id: 248711
Operator id: 277341
Patient temperature: 100.8
pH, Arterial: 7.302 — ABNORMAL LOW

## 2011-09-22 LAB — AMMONIA: Ammonia: 31

## 2011-09-22 LAB — CARDIAC PANEL(CRET KIN+CKTOT+MB+TROPI)
CK, MB: 1.1
CK, MB: 1.5
CK, MB: 1.8
CK, MB: 12.8 — ABNORMAL HIGH
CK, MB: 3.5
CK, MB: 7.8 — ABNORMAL HIGH
Relative Index: 0.3
Relative Index: 0.3
Relative Index: 0.4
Relative Index: 0.4
Relative Index: 0.5
Relative Index: 0.6
Total CK: 251 — ABNORMAL HIGH
Total CK: 362 — ABNORMAL HIGH
Total CK: 529 — ABNORMAL HIGH
Total CK: 668 — ABNORMAL HIGH
Total CK: 868 — ABNORMAL HIGH
Troponin I: 0.15 — ABNORMAL HIGH
Troponin I: 0.18 — ABNORMAL HIGH
Troponin I: 0.35 — ABNORMAL HIGH
Troponin I: 0.41 — ABNORMAL HIGH

## 2011-09-22 LAB — CBC
HCT: 27.4 — ABNORMAL LOW
HCT: 28 — ABNORMAL LOW
HCT: 28.8 — ABNORMAL LOW
HCT: 29.3 — ABNORMAL LOW
HCT: 30.4 — ABNORMAL LOW
Hemoglobin: 10.1 — ABNORMAL LOW
Hemoglobin: 8.1 — ABNORMAL LOW
Hemoglobin: 8.5 — ABNORMAL LOW
Hemoglobin: 9 — ABNORMAL LOW
Hemoglobin: 9.2 — ABNORMAL LOW
Hemoglobin: 9.2 — ABNORMAL LOW
Hemoglobin: 9.4 — ABNORMAL LOW
Hemoglobin: 9.4 — ABNORMAL LOW
Hemoglobin: 9.5 — ABNORMAL LOW
MCHC: 33
MCHC: 33.8
MCHC: 33.9
MCV: 84.2
MCV: 85.5
MCV: 86.9
MCV: 87.5
MCV: 87.6
Platelets: 137 — ABNORMAL LOW
Platelets: 167
Platelets: 79 — ABNORMAL LOW
Platelets: 86 — ABNORMAL LOW
Platelets: 94 — ABNORMAL LOW
Platelets: 96 — ABNORMAL LOW
RBC: 2.82 — ABNORMAL LOW
RBC: 2.98 — ABNORMAL LOW
RBC: 3.21 — ABNORMAL LOW
RBC: 3.33 — ABNORMAL LOW
RBC: 3.55 — ABNORMAL LOW
RDW: 16.3 — ABNORMAL HIGH
RDW: 16.5 — ABNORMAL HIGH
RDW: 16.5 — ABNORMAL HIGH
RDW: 16.6 — ABNORMAL HIGH
RDW: 16.6 — ABNORMAL HIGH
RDW: 16.7 — ABNORMAL HIGH
RDW: 16.9 — ABNORMAL HIGH
RDW: 17.6 — ABNORMAL HIGH
WBC: 5.8
WBC: 6.4
WBC: 8.1
WBC: 8.7

## 2011-09-22 LAB — COMPREHENSIVE METABOLIC PANEL
ALT: <8
AST: 56 — ABNORMAL HIGH
Albumin: 3.3 — ABNORMAL LOW
Alkaline Phosphatase: 53
Alkaline Phosphatase: 65
Alkaline Phosphatase: 75
BUN: 30 — ABNORMAL HIGH
BUN: 44 — ABNORMAL HIGH
Calcium: 8.6
Creatinine, Ser: 1.97 — ABNORMAL HIGH
GFR calc Af Amer: 22 — ABNORMAL LOW
Glucose, Bld: 186 — ABNORMAL HIGH
Glucose, Bld: 72
Glucose, Bld: 85
Potassium: 4.1
Potassium: 4.3
Sodium: 139
Total Bilirubin: 1.5 — ABNORMAL HIGH
Total Protein: 5.3 — ABNORMAL LOW
Total Protein: 5.7 — ABNORMAL LOW
Total Protein: 6.2

## 2011-09-22 LAB — CROSSMATCH

## 2011-09-22 LAB — LACTATE DEHYDROGENASE: LDH: 239

## 2011-09-22 LAB — DIFFERENTIAL
Basophils Relative: 0
Basophils Relative: 0
Basophils Relative: 0
Eosinophils Absolute: 0
Eosinophils Relative: 0
Eosinophils Relative: 0
Eosinophils Relative: 0
Lymphocytes Relative: 4 — ABNORMAL LOW
Lymphocytes Relative: 7 — ABNORMAL LOW
Lymphs Abs: 0.2 — ABNORMAL LOW
Lymphs Abs: 0.7
Monocytes Absolute: 0.2
Monocytes Absolute: 0.2
Monocytes Relative: 10
Monocytes Relative: 3
Neutro Abs: 4.8
Neutro Abs: 5.4
Neutro Abs: 8.2 — ABNORMAL HIGH
Neutrophils Relative %: 83 — ABNORMAL HIGH
Neutrophils Relative %: 92 — ABNORMAL HIGH
Neutrophils Relative %: 93 — ABNORMAL HIGH
WBC Morphology: INCREASED
WBC Morphology: INCREASED

## 2011-09-22 LAB — HSV PCR: HSV, PCR: NEGATIVE

## 2011-09-22 LAB — BODY FLUID CULTURE: Culture: NO GROWTH

## 2011-09-22 LAB — CULTURE, BLOOD (ROUTINE X 2)
Culture: NO GROWTH
Culture: NO GROWTH

## 2011-09-22 LAB — GRAM STAIN

## 2011-09-22 LAB — POCT CARDIAC MARKERS
CKMB, poc: <1 — ABNORMAL LOW
Myoglobin, poc: >500
Operator id: 196461

## 2011-09-22 LAB — BLOOD GAS, ARTERIAL
Bicarbonate: 19.8 — ABNORMAL LOW
O2 Content: 2
Patient temperature: 97
pCO2 arterial: 32.2 — ABNORMAL LOW
pH, Arterial: 7.401

## 2011-09-22 LAB — HEPATIC FUNCTION PANEL
AST: 29
Bilirubin, Direct: 0.5 — ABNORMAL HIGH
Total Protein: 6.2

## 2011-09-22 LAB — URINALYSIS, ROUTINE W REFLEX MICROSCOPIC
Bilirubin Urine: NEGATIVE
Glucose, UA: NEGATIVE
Ketones, ur: 15 — AB
Ketones, ur: 15 — AB
Leukocytes, UA: NEGATIVE
Leukocytes, UA: NEGATIVE
Nitrite: NEGATIVE
Protein, ur: 100 — AB
Specific Gravity, Urine: 1.024
Urobilinogen, UA: 1
Urobilinogen, UA: 1

## 2011-09-22 LAB — ROCKY MTN SPOTTED FVR AB, IGM-BLOOD: RMSF IgM: 0.13

## 2011-09-22 LAB — CK TOTAL AND CKMB (NOT AT ARMC)
CK, MB: 0.7
Relative Index: 0.3

## 2011-09-22 LAB — URINE MICROSCOPIC-ADD ON

## 2011-09-22 LAB — APTT: aPTT: 39 — ABNORMAL HIGH

## 2011-09-22 LAB — PHOSPHORUS: Phosphorus: 4.4

## 2011-09-22 LAB — MAGNESIUM: Magnesium: 1.9

## 2011-09-22 LAB — PROTIME-INR
INR: 1.2
Prothrombin Time: 15.7 — ABNORMAL HIGH

## 2011-09-22 LAB — PROTEIN AND GLUCOSE, CSF: Total  Protein, CSF: 38

## 2011-09-22 LAB — HEMOGLOBIN A1C: Hgb A1c MFr Bld: 6.2 — ABNORMAL HIGH

## 2011-09-22 LAB — SEDIMENTATION RATE: Sed Rate: 48 — ABNORMAL HIGH

## 2011-09-22 LAB — LACTIC ACID, PLASMA: Lactic Acid, Venous: 0.8

## 2011-09-22 LAB — ROCKY MTN SPOTTED FVR AB, IGG-BLOOD: RMSF IgG: 0.15 {ISR}

## 2011-09-22 LAB — D-DIMER, QUANTITATIVE: D-Dimer, Quant: 14.63 — ABNORMAL HIGH

## 2011-09-22 LAB — FIBRINOGEN: Fibrinogen: 701 — ABNORMAL HIGH

## 2013-05-02 ENCOUNTER — Other Ambulatory Visit: Payer: Self-pay | Admitting: Cardiovascular Disease

## 2013-05-02 LAB — CBC WITH DIFFERENTIAL/PLATELET
HCT: 36.4 % — ABNORMAL LOW (ref 39.0–52.0)
Hemoglobin: 12 g/dL — ABNORMAL LOW (ref 13.0–17.0)
Lymphs Abs: 1.8 10*3/uL (ref 0.7–4.0)
MCH: 28.5 pg (ref 26.0–34.0)
Monocytes Relative: 9 % (ref 3–12)
Neutro Abs: 2.2 10*3/uL (ref 1.7–7.7)
Neutrophils Relative %: 47 % (ref 43–77)
RBC: 4.21 MIL/uL — ABNORMAL LOW (ref 4.22–5.81)

## 2013-05-03 LAB — COMPREHENSIVE METABOLIC PANEL
Albumin: 3.7 g/dL (ref 3.5–5.2)
CO2: 24 mEq/L (ref 19–32)
Calcium: 8.6 mg/dL (ref 8.4–10.5)
Glucose, Bld: 124 mg/dL — ABNORMAL HIGH (ref 70–99)
Potassium: 4.8 mEq/L (ref 3.5–5.3)
Sodium: 137 mEq/L (ref 135–145)
Total Protein: 6.3 g/dL (ref 6.0–8.3)

## 2013-05-03 LAB — TSH: TSH: 3.964 u[IU]/mL (ref 0.350–4.500)

## 2013-05-03 LAB — T4, FREE: Free T4: 1.01 ng/dL (ref 0.80–1.80)

## 2013-05-04 ENCOUNTER — Other Ambulatory Visit (HOSPITAL_COMMUNITY): Payer: Self-pay | Admitting: Cardiovascular Disease

## 2013-05-04 DIAGNOSIS — I5021 Acute systolic (congestive) heart failure: Secondary | ICD-10-CM

## 2013-05-04 DIAGNOSIS — I447 Left bundle-branch block, unspecified: Secondary | ICD-10-CM

## 2013-05-10 ENCOUNTER — Encounter: Payer: Self-pay | Admitting: Cardiovascular Disease

## 2013-05-16 ENCOUNTER — Ambulatory Visit (HOSPITAL_COMMUNITY)
Admission: RE | Admit: 2013-05-16 | Discharge: 2013-05-16 | Disposition: A | Discharge status: Home / Self Care | Payer: Medicare Other | Source: Ambulatory Visit | Attending: Cardiovascular Disease | Admitting: Cardiovascular Disease

## 2013-05-16 DIAGNOSIS — Z951 Presence of aortocoronary bypass graft: Secondary | ICD-10-CM | POA: Insufficient documentation

## 2013-05-16 DIAGNOSIS — E785 Hyperlipidemia, unspecified: Secondary | ICD-10-CM | POA: Insufficient documentation

## 2013-05-16 DIAGNOSIS — I2510 Atherosclerotic heart disease of native coronary artery without angina pectoris: Secondary | ICD-10-CM | POA: Insufficient documentation

## 2013-05-16 DIAGNOSIS — E119 Type 2 diabetes mellitus without complications: Secondary | ICD-10-CM | POA: Insufficient documentation

## 2013-05-16 DIAGNOSIS — Z87891 Personal history of nicotine dependence: Secondary | ICD-10-CM | POA: Insufficient documentation

## 2013-05-16 DIAGNOSIS — I447 Left bundle-branch block, unspecified: Secondary | ICD-10-CM | POA: Insufficient documentation

## 2013-05-16 NOTE — Progress Notes (Signed)
[facility]   2D echo completed 05/16/2013.   Cindy Mckaila Duffus, RDCS  

## 2013-05-31 ENCOUNTER — Encounter: Payer: Self-pay | Admitting: Nurse Practitioner

## 2013-06-02 ENCOUNTER — Encounter: Payer: Self-pay | Admitting: Nurse Practitioner

## 2013-06-02 ENCOUNTER — Ambulatory Visit (INDEPENDENT_AMBULATORY_CARE_PROVIDER_SITE_OTHER): Payer: Medicare Other | Admitting: Nurse Practitioner

## 2013-06-02 VITALS — BP 97/53 | HR 79 | Ht 67.0 in | Wt 210.0 lb

## 2013-06-02 DIAGNOSIS — R269 Unspecified abnormalities of gait and mobility: Secondary | ICD-10-CM | POA: Insufficient documentation

## 2013-06-02 DIAGNOSIS — G20A1 Parkinson's disease without dyskinesia, without mention of fluctuations: Secondary | ICD-10-CM

## 2013-06-02 DIAGNOSIS — R259 Unspecified abnormal involuntary movements: Secondary | ICD-10-CM

## 2013-06-02 DIAGNOSIS — G20 Parkinson's disease: Secondary | ICD-10-CM | POA: Insufficient documentation

## 2013-06-02 MED ORDER — CARBIDOPA-LEVODOPA 25-100 MG PO TABS: 1.0000 | ORAL_TABLET | Freq: Three times a day (TID) | ORAL | Status: DC

## 2013-06-02 NOTE — Patient Instructions (Addendum)
Sinemet 3 times a day before meals will refill Current exercise can do chair exercises for legs Followup in 6 months

## 2013-06-02 NOTE — Progress Notes (Signed)
HPI: This returns for followup after her last visit 12/02/2012. He has past  past medical history of hypertension, diabetes,coronary artery disease, hyperlipidemia,  and benign prostate hypertrophy. He also has Parkinson's disease.He has responded well to Sinemet, his initial presentation was resting tremor, and gait difficulty. He has been taking Sinemet 50/200 CR qid, tolerated it very well.  He did report  improvement in his walking,  1 fall in 6 months. Uses a cane somedays, most days not any assistive device.  Denies side effects to his Sinemet, no confusion rare hallucination.    He denies urinary bowel incontinence, he has occasional urinary frequency due to his prostate hypertrophy. He denies problems with swallowing or speech.  Tremor is intermittent .   he continues to live alone. No new neurologic complaints.   Denies any significant wearing off, he has been having few  visual hallucinations, he has good appetite, sleeps well most of the time, ambulate with a cane, complains of knee pain. Sinemet dose was decreased due to his hallucinations and they have improved Some periods of confusion over last 6 months, but not significant according to daughter.  No falls, using cane. Lives alone cooks his breakfast, family brings in supper meal. Family dose the finances. Independent with bathing and dressing. Good appetite, has gained weight, not exercising much.  ROS:  Increased thirst, allergies, swelling in legs, memory loss  Physical Exam General: well developed, well nourished, seated, in no evident distress Head: head normocephalic and atraumatic. Oropharynx benign Neck: supple with no carotid  bruits Cardiovascular: regular rate and rhythm, no murmurs  Neurologic Exam Mental Status: Awake and fully alert. MMSE 27/30. Missed one of 3 recall. Follows all commands. Speech and language normal.   Cranial Nerves: Pupils equal, briskly reactive to light. Extraocular movements full without nystagmus.  Visual fields full to confrontation. Hard of hearing Face, tongue, palate move normally and symmetrically. Neck flexion and extension normal.  Motor: Normal bulk and tone. Normal strength in all tested extremity muscles.No focal weakness Coordinationg Finger-to-nose and heel-to-shin performed accurately bilaterally. No dysmetria Gait and Station: Arises from chair pushing off. Wide-based cautious  mildly unsteady, stooped forward posture, using cane. No difficulty with turns   Reflexes: 2+ and symmetric except trace Achilles. Toes downgoing.     ASSESSMENT: Gait abnormality, mild parkinsonian features, Mini-Mental Status exam stable     PLAN: Sinemet 3 times a day before meals, will refill Continue  exercise can do chair exercises for legs Followup in 6 months  Nilda Riggs, GNP-BC APRN

## 2013-09-19 ENCOUNTER — Other Ambulatory Visit: Payer: Self-pay | Admitting: *Deleted

## 2013-09-19 MED ORDER — FUROSEMIDE 40 MG PO TABS: ORAL_TABLET | ORAL | Status: DC

## 2013-11-24 ENCOUNTER — Encounter: Payer: Self-pay | Admitting: Neurology

## 2013-11-24 ENCOUNTER — Ambulatory Visit (INDEPENDENT_AMBULATORY_CARE_PROVIDER_SITE_OTHER): Payer: Medicare Other | Admitting: Neurology

## 2013-11-24 VITALS — BP 106/66 | HR 102 | Ht 67.0 in | Wt 206.0 lb

## 2013-11-24 DIAGNOSIS — R259 Unspecified abnormal involuntary movements: Secondary | ICD-10-CM

## 2013-11-24 DIAGNOSIS — G20 Parkinson's disease: Secondary | ICD-10-CM

## 2013-11-24 DIAGNOSIS — R269 Unspecified abnormalities of gait and mobility: Secondary | ICD-10-CM

## 2013-11-24 MED ORDER — QUETIAPINE FUMARATE 25 MG PO TABS: ORAL_TABLET | ORAL | Status: DC

## 2013-11-24 MED ORDER — CARBIDOPA-LEVODOPA 25-100 MG PO TABS: 1.0000 | ORAL_TABLET | Freq: Three times a day (TID) | ORAL | Status: DC

## 2013-11-24 NOTE — Progress Notes (Signed)
HPI:   This returns for followup after her last visit in June 2014. He has past  past medical history of hypertension, diabetes,coronary artery disease, hyperlipidemia,  and benign prostate hypertrophy. He also has Parkinson's disease.He has responded well to Sinemet, his initial presentation was resting tremor, and gait difficulty, consistent with mild Parkinson's disease,  He is not taking Sinemet 25/100 mg 3 times a day, tolerated it very well.  He did report improvement in his walking,  1 fall in 6 months. Uses a cane somedays, most days not any assistive device.  Denies side effects to his Sinemet, no confusion rare hallucination.    He denies urinary bowel incontinence, he has occasional urinary frequency due to his prostate hypertrophy. He denies problems with swallowing or speech.  Tremor is intermittent .   he continues to live alone. No new neurologic complaints.   Denies any significant wearing off, he has been having few visual hallucinations was put on Hadol 5mg  qhs by his primary care physician since 2013, which has been very helpful,  He has good appetite, sleeps well most of the time, ambulate with a cane, complains of knee pain.    Some periods of confusion over last 6 months, but not significant according to daughter.  No falls, using cane. Lives alone cooks his breakfast, family brings in supper meal. Family dose the finances. Independent with bathing and dressing. Good appetite, has gained weight, not exercising much.  UPDATE 11/24/2013: He still lives alone, much less active now, his family check on him daily, he has good appetite, sleeping well, last visual hallucination with Haldol, but he complains of low back pain, bilateral lower extremity gave out underneath him with prolonged standing, last visit in June 2014, he was able to walk from the waiting area to the exam room, today he came in with a wheelchair, he denies bowel and bladder incontinence, hard of hearing,   ROS:   Increased thirst, allergies, swelling in legs, memory loss  Physical Exam General: well developed, well nourished, seated, in no evident distress Head: head normocephalic and atraumatic. Oropharynx benign Neck: supple with no carotid  bruits Cardiovascular: regular rate and rhythm, no murmurs  Neurologic Exam Mental Status: Awake and fully alert. Hard of hearing, following commands, Speech and language normal.   Cranial Nerves: Pupils equal, briskly reactive to light. Extraocular movements full without nystagmus. Visual fields full to confrontation. Hard of hearing Face, tongue, palate move normally and symmetrically. Neck flexion and extension normal.  Motor: He has mild bilaterally hands resting tremor, right more than left, mild bilateral upper extremity rigidity, increased with reinforcement maneuver, Coordinationg Finger-to-nose and heel-to-shin performed accurately bilaterally. No dysmetria Gait and Station: He needs multiple efforts to get up from seated position, holding on a chair arm, have a tendency to lean backwards, cautious, small shuffling gait, using cane, Reflexes: Present and symmetric except trace Achilles. Toes downgoing.     ASSESSMENT: 79 years old male, with history of mild parkinsonian features, visual hallucinations, steady slow decline in functional status, also complains of low back pain,  1 continue Sinemet 25/100 mg 3 times a day 2 stop Haldol, I will start seroquel, 25 mg, may titrating to 2 tablets every night, 3 return to clinic in 6-9 months with Eber Jones .

## 2013-12-16 ENCOUNTER — Encounter (HOSPITAL_COMMUNITY): Payer: Self-pay | Admitting: Family Medicine

## 2013-12-16 ENCOUNTER — Emergency Department (HOSPITAL_COMMUNITY): Payer: Medicare Other

## 2013-12-16 ENCOUNTER — Inpatient Hospital Stay (HOSPITAL_COMMUNITY)
Admission: EM | Admit: 2013-12-16 | Discharge: 2013-12-19 | DRG: 056 | Disposition: A | Discharge status: Skilled Nursing Facility | Payer: Medicare Other | Attending: Internal Medicine | Admitting: Internal Medicine

## 2013-12-16 DIAGNOSIS — E785 Hyperlipidemia, unspecified: Secondary | ICD-10-CM | POA: Diagnosis present

## 2013-12-16 DIAGNOSIS — G9340 Encephalopathy, unspecified: Secondary | ICD-10-CM | POA: Diagnosis present

## 2013-12-16 DIAGNOSIS — N179 Acute kidney failure, unspecified: Secondary | ICD-10-CM | POA: Diagnosis present

## 2013-12-16 DIAGNOSIS — R55 Syncope and collapse: Secondary | ICD-10-CM | POA: Diagnosis present

## 2013-12-16 DIAGNOSIS — I10 Essential (primary) hypertension: Secondary | ICD-10-CM | POA: Diagnosis present

## 2013-12-16 DIAGNOSIS — Z951 Presence of aortocoronary bypass graft: Secondary | ICD-10-CM

## 2013-12-16 DIAGNOSIS — Z66 Do not resuscitate: Secondary | ICD-10-CM | POA: Diagnosis present

## 2013-12-16 DIAGNOSIS — E860 Dehydration: Secondary | ICD-10-CM | POA: Diagnosis present

## 2013-12-16 DIAGNOSIS — R5381 Other malaise: Secondary | ICD-10-CM | POA: Diagnosis present

## 2013-12-16 DIAGNOSIS — R259 Unspecified abnormal involuntary movements: Secondary | ICD-10-CM | POA: Diagnosis present

## 2013-12-16 DIAGNOSIS — N189 Chronic kidney disease, unspecified: Secondary | ICD-10-CM | POA: Diagnosis present

## 2013-12-16 DIAGNOSIS — Z9181 History of falling: Secondary | ICD-10-CM

## 2013-12-16 DIAGNOSIS — D649 Anemia, unspecified: Secondary | ICD-10-CM | POA: Diagnosis present

## 2013-12-16 DIAGNOSIS — H3530 Unspecified macular degeneration: Secondary | ICD-10-CM

## 2013-12-16 DIAGNOSIS — R4182 Altered mental status, unspecified: Secondary | ICD-10-CM | POA: Diagnosis present

## 2013-12-16 DIAGNOSIS — Z885 Allergy status to narcotic agent status: Secondary | ICD-10-CM

## 2013-12-16 DIAGNOSIS — F0390 Unspecified dementia without behavioral disturbance: Secondary | ICD-10-CM | POA: Diagnosis present

## 2013-12-16 DIAGNOSIS — Z87891 Personal history of nicotine dependence: Secondary | ICD-10-CM

## 2013-12-16 DIAGNOSIS — D638 Anemia in other chronic diseases classified elsewhere: Secondary | ICD-10-CM | POA: Diagnosis present

## 2013-12-16 DIAGNOSIS — W19XXXA Unspecified fall, initial encounter: Secondary | ICD-10-CM

## 2013-12-16 DIAGNOSIS — G20 Parkinson's disease: Principal | ICD-10-CM | POA: Diagnosis present

## 2013-12-16 DIAGNOSIS — I2510 Atherosclerotic heart disease of native coronary artery without angina pectoris: Secondary | ICD-10-CM | POA: Diagnosis present

## 2013-12-16 DIAGNOSIS — G20A1 Parkinson's disease without dyskinesia, without mention of fluctuations: Principal | ICD-10-CM | POA: Diagnosis present

## 2013-12-16 DIAGNOSIS — R532 Functional quadriplegia: Secondary | ICD-10-CM | POA: Diagnosis present

## 2013-12-16 DIAGNOSIS — I129 Hypertensive chronic kidney disease with stage 1 through stage 4 chronic kidney disease, or unspecified chronic kidney disease: Secondary | ICD-10-CM | POA: Diagnosis present

## 2013-12-16 DIAGNOSIS — Z6830 Body mass index (BMI) 30.0-30.9, adult: Secondary | ICD-10-CM

## 2013-12-16 DIAGNOSIS — E119 Type 2 diabetes mellitus without complications: Secondary | ICD-10-CM | POA: Diagnosis present

## 2013-12-16 DIAGNOSIS — E440 Moderate protein-calorie malnutrition: Secondary | ICD-10-CM | POA: Diagnosis present

## 2013-12-16 DIAGNOSIS — Z791 Long term (current) use of non-steroidal anti-inflammatories (NSAID): Secondary | ICD-10-CM

## 2013-12-16 DIAGNOSIS — R627 Adult failure to thrive: Secondary | ICD-10-CM | POA: Diagnosis present

## 2013-12-16 DIAGNOSIS — I509 Heart failure, unspecified: Secondary | ICD-10-CM | POA: Diagnosis present

## 2013-12-16 DIAGNOSIS — Z79899 Other long term (current) drug therapy: Secondary | ICD-10-CM

## 2013-12-16 DIAGNOSIS — Z7982 Long term (current) use of aspirin: Secondary | ICD-10-CM

## 2013-12-16 DIAGNOSIS — R269 Unspecified abnormalities of gait and mobility: Secondary | ICD-10-CM | POA: Diagnosis present

## 2013-12-16 DIAGNOSIS — R1310 Dysphagia, unspecified: Secondary | ICD-10-CM | POA: Diagnosis present

## 2013-12-16 DIAGNOSIS — R29810 Facial weakness: Secondary | ICD-10-CM | POA: Diagnosis not present

## 2013-12-16 DIAGNOSIS — I5032 Chronic diastolic (congestive) heart failure: Secondary | ICD-10-CM | POA: Diagnosis present

## 2013-12-16 DIAGNOSIS — N400 Enlarged prostate without lower urinary tract symptoms: Secondary | ICD-10-CM

## 2013-12-16 HISTORY — DX: Anemia, unspecified: D64.9

## 2013-12-16 HISTORY — DX: Unspecified macular degeneration: H35.30

## 2013-12-16 HISTORY — DX: Parkinson's disease without dyskinesia, without mention of fluctuations: G20.A1

## 2013-12-16 HISTORY — DX: Presence of aortocoronary bypass graft: Z95.1

## 2013-12-16 HISTORY — DX: Heart failure, unspecified: I50.9

## 2013-12-16 HISTORY — DX: Parkinson's disease: G20

## 2013-12-16 HISTORY — DX: Other malaise: R53.81

## 2013-12-16 HISTORY — DX: Type 2 diabetes mellitus without complications: E11.9

## 2013-12-16 HISTORY — DX: Benign prostatic hyperplasia without lower urinary tract symptoms: N40.0

## 2013-12-16 HISTORY — DX: Atherosclerotic heart disease of native coronary artery without angina pectoris: I25.10

## 2013-12-16 LAB — CBC WITH DIFFERENTIAL/PLATELET
Basophils Absolute: 0 10*3/uL (ref 0.0–0.1)
Basophils Relative: 0 % (ref 0–1)
Eosinophils Absolute: 0.1 10*3/uL (ref 0.0–0.7)
Eosinophils Relative: 2 % (ref 0–5)
HCT: 34 % — ABNORMAL LOW (ref 39.0–52.0)
Hemoglobin: 11.2 g/dL — ABNORMAL LOW (ref 13.0–17.0)
Lymphocytes Relative: 15 % (ref 12–46)
Lymphs Abs: 1 10*3/uL (ref 0.7–4.0)
MCH: 28.9 pg (ref 26.0–34.0)
MCHC: 32.9 g/dL (ref 30.0–36.0)
MCV: 87.6 fL (ref 78.0–100.0)
Monocytes Absolute: 0.7 10*3/uL (ref 0.1–1.0)
Monocytes Relative: 10 % (ref 3–12)
Neutro Abs: 5.1 10*3/uL (ref 1.7–7.7)
Neutrophils Relative %: 73 % (ref 43–77)
Platelets: 116 10*3/uL — ABNORMAL LOW (ref 150–400)
RBC: 3.88 MIL/uL — ABNORMAL LOW (ref 4.22–5.81)
RDW: 16 % — ABNORMAL HIGH (ref 11.5–15.5)
WBC: 6.9 10*3/uL (ref 4.0–10.5)

## 2013-12-16 LAB — COMPREHENSIVE METABOLIC PANEL
AST: 37 U/L (ref 0–37)
Albumin: 3.8 g/dL (ref 3.5–5.2)
Alkaline Phosphatase: 99 U/L (ref 39–117)
BUN: 44 mg/dL — AB (ref 6–23)
CO2: 20 meq/L (ref 19–32)
Calcium: 8.9 mg/dL (ref 8.4–10.5)
Chloride: 107 mEq/L (ref 96–112)
Creatinine, Ser: 2.97 mg/dL — ABNORMAL HIGH (ref 0.50–1.35)
GFR, EST AFRICAN AMERICAN: 20 mL/min — AB (ref >90)
GFR, EST NON AFRICAN AMERICAN: 17 mL/min — AB (ref >90)
GLUCOSE: 121 mg/dL — AB (ref 70–99)
POTASSIUM: 4.6 meq/L (ref 3.7–5.3)
Sodium: 141 mEq/L (ref 137–147)
TOTAL PROTEIN: 6.9 g/dL (ref 6.0–8.3)
Total Bilirubin: 0.8 mg/dL (ref 0.3–1.2)

## 2013-12-16 LAB — URINALYSIS W MICROSCOPIC + REFLEX CULTURE
BILIRUBIN URINE: NEGATIVE
Glucose, UA: NEGATIVE mg/dL
HGB URINE DIPSTICK: NEGATIVE
Ketones, ur: NEGATIVE mg/dL
Leukocytes, UA: NEGATIVE
NITRITE: NEGATIVE
PROTEIN: NEGATIVE mg/dL
SPECIFIC GRAVITY, URINE: 1.016 (ref 1.005–1.030)
UROBILINOGEN UA: 0.2 mg/dL (ref 0.0–1.0)
pH: 5.5 (ref 5.0–8.0)

## 2013-12-16 LAB — TSH: TSH: 1.7 u[IU]/mL (ref 0.350–4.500)

## 2013-12-16 LAB — GLUCOSE, CAPILLARY
Glucose-Capillary: 116 mg/dL — ABNORMAL HIGH (ref 70–99)
Glucose-Capillary: 122 mg/dL — ABNORMAL HIGH (ref 70–99)

## 2013-12-16 LAB — TROPONIN I
Troponin I: <0.3 ng/mL (ref <0.30)
Troponin I: <0.3 ng/mL (ref <0.30)

## 2013-12-16 LAB — PRO B NATRIURETIC PEPTIDE: PRO B NATRI PEPTIDE: 4795 pg/mL — AB (ref 0–450)

## 2013-12-16 LAB — HEMOGLOBIN A1C
HEMOGLOBIN A1C: 7 % — AB (ref <5.7)
MEAN PLASMA GLUCOSE: 154 mg/dL — AB (ref <117)

## 2013-12-16 MED ORDER — TAMSULOSIN HCL 0.4 MG PO CAPS
0.4000 mg | ORAL_CAPSULE | Freq: Every day | ORAL | Status: DC
  Administered 2013-12-16 – 2013-12-19 (×2): 0.4 mg via ORAL
  Filled 2013-12-16 (×4): qty 1

## 2013-12-16 MED ORDER — HEPARIN SODIUM (PORCINE) 5000 UNIT/ML IJ SOLN
5000.0000 [IU] | Freq: Three times a day (TID) | INTRAMUSCULAR | Status: DC
  Administered 2013-12-16 – 2013-12-19 (×9): 5000 [IU] via SUBCUTANEOUS
  Filled 2013-12-16 (×13): qty 1

## 2013-12-16 MED ORDER — INSULIN ASPART 100 UNIT/ML ~~LOC~~ SOLN
0.0000 [IU] | SUBCUTANEOUS | Status: DC
  Administered 2013-12-16 – 2013-12-17 (×3): 2 [IU] via SUBCUTANEOUS
  Administered 2013-12-18 (×2): 3 [IU] via SUBCUTANEOUS
  Administered 2013-12-18 (×2): 2 [IU] via SUBCUTANEOUS
  Administered 2013-12-19: 5 [IU] via SUBCUTANEOUS
  Administered 2013-12-19: 01:00:00 2 [IU] via SUBCUTANEOUS

## 2013-12-16 MED ORDER — METOPROLOL TARTRATE 1 MG/ML IV SOLN
5.0000 mg | Freq: Two times a day (BID) | INTRAVENOUS | Status: DC
  Administered 2013-12-16 – 2013-12-19 (×6): 5 mg via INTRAVENOUS
  Filled 2013-12-16 (×8): qty 5

## 2013-12-16 MED ORDER — METOPROLOL TARTRATE 50 MG PO TABS
50.0000 mg | ORAL_TABLET | Freq: Two times a day (BID) | ORAL | Status: DC
  Administered 2013-12-16: 50 mg via ORAL
  Filled 2013-12-16: qty 1

## 2013-12-16 MED ORDER — MORPHINE SULFATE 2 MG/ML IJ SOLN: 1.0000 mg | INTRAMUSCULAR | Status: DC | PRN

## 2013-12-16 MED ORDER — LISINOPRIL 10 MG PO TABS
10.0000 mg | ORAL_TABLET | Freq: Every day | ORAL | Status: DC
  Administered 2013-12-16: 16:00:00 10 mg via ORAL
  Filled 2013-12-16 (×2): qty 1

## 2013-12-16 MED ORDER — CARBIDOPA-LEVODOPA ER 25-100 MG PO TBCR
1.0000 | EXTENDED_RELEASE_TABLET | Freq: Three times a day (TID) | ORAL | Status: DC
  Administered 2013-12-16 – 2013-12-19 (×4): 1 via ORAL
  Filled 2013-12-16 (×11): qty 1

## 2013-12-16 MED ORDER — SODIUM CHLORIDE 0.9 % IJ SOLN
3.0000 mL | Freq: Two times a day (BID) | INTRAMUSCULAR | Status: DC
  Administered 2013-12-18: 3 mL via INTRAVENOUS

## 2013-12-16 MED ORDER — LORAZEPAM 2 MG/ML IJ SOLN
1.0000 mg | INTRAMUSCULAR | Status: DC | PRN
  Administered 2013-12-18: 03:00:00 1 mg via INTRAVENOUS
  Filled 2013-12-16: qty 1

## 2013-12-16 MED ORDER — FUROSEMIDE 20 MG PO TABS: 20.0000 mg | ORAL_TABLET | Freq: Every day | ORAL | Status: DC

## 2013-12-16 MED ORDER — FUROSEMIDE 10 MG/ML IJ SOLN
20.0000 mg | Freq: Every day | INTRAMUSCULAR | Status: DC
  Administered 2013-12-17: 12:00:00 20 mg via INTRAVENOUS
  Filled 2013-12-16: qty 2

## 2013-12-16 MED ORDER — ASPIRIN EC 81 MG PO TBEC
81.0000 mg | DELAYED_RELEASE_TABLET | Freq: Every day | ORAL | Status: DC
  Administered 2013-12-16 – 2013-12-19 (×2): 81 mg via ORAL
  Filled 2013-12-16 (×4): qty 1

## 2013-12-16 MED ORDER — BISACODYL 10 MG RE SUPP: 10.0000 mg | Freq: Every day | RECTAL | Status: DC | PRN

## 2013-12-16 MED ORDER — SODIUM CHLORIDE 0.9 % IV BOLUS (SEPSIS)
1000.0000 mL | INTRAVENOUS | Status: AC
  Administered 2013-12-16: 1000 mL via INTRAVENOUS

## 2013-12-16 MED ORDER — QUETIAPINE FUMARATE 25 MG PO TABS
25.0000 mg | ORAL_TABLET | Freq: Every day | ORAL | Status: DC
  Administered 2013-12-18: 21:00:00 25 mg via ORAL
  Filled 2013-12-16 (×4): qty 1

## 2013-12-16 MED ORDER — SIMVASTATIN 20 MG PO TABS
20.0000 mg | ORAL_TABLET | Freq: Every day | ORAL | Status: DC
  Administered 2013-12-18: 20 mg via ORAL
  Filled 2013-12-16 (×4): qty 1

## 2013-12-16 MED ORDER — ACETAMINOPHEN 325 MG PO TABS: 650.0000 mg | ORAL_TABLET | Freq: Four times a day (QID) | ORAL | Status: DC | PRN

## 2013-12-16 MED ORDER — LORAZEPAM 2 MG/ML IJ SOLN: 1.0000 mg | Freq: Once | INTRAMUSCULAR | Status: DC

## 2013-12-16 MED ORDER — ACETAMINOPHEN 650 MG RE SUPP: 650.0000 mg | Freq: Four times a day (QID) | RECTAL | Status: DC | PRN

## 2013-12-16 MED ORDER — DOCUSATE SODIUM 100 MG PO CAPS
100.0000 mg | ORAL_CAPSULE | Freq: Two times a day (BID) | ORAL | Status: DC
  Administered 2013-12-16 – 2013-12-19 (×3): 100 mg via ORAL
  Filled 2013-12-16 (×7): qty 1

## 2013-12-16 MED ORDER — SODIUM CHLORIDE 0.9 % IV SOLN
INTRAVENOUS | Status: DC
  Administered 2013-12-16: 50 mL/h via INTRAVENOUS

## 2013-12-16 MED ORDER — POTASSIUM CHLORIDE ER 10 MEQ PO TBCR
10.0000 meq | EXTENDED_RELEASE_TABLET | Freq: Every day | ORAL | Status: DC
  Administered 2013-12-16: 10 meq via ORAL
  Filled 2013-12-16: qty 1

## 2013-12-16 MED ORDER — PANTOPRAZOLE SODIUM 40 MG PO TBEC
40.0000 mg | DELAYED_RELEASE_TABLET | Freq: Two times a day (BID) | ORAL | Status: DC
  Administered 2013-12-16: 17:00:00 40 mg via ORAL

## 2013-12-16 MED ORDER — INSULIN ASPART 100 UNIT/ML ~~LOC~~ SOLN: 0.0000 [IU] | Freq: Three times a day (TID) | SUBCUTANEOUS | Status: DC

## 2013-12-16 MED ORDER — PANTOPRAZOLE SODIUM 40 MG IV SOLR
40.0000 mg | Freq: Two times a day (BID) | INTRAVENOUS | Status: DC
  Administered 2013-12-16 – 2013-12-19 (×6): 40 mg via INTRAVENOUS
  Filled 2013-12-16 (×7): qty 40

## 2013-12-16 NOTE — Progress Notes (Signed)
Pt cannot pass swallow screen test,  Pt unable to drink out of straw and unable to chew cracker,  Pt has very poor dental hygiene,  Pt is not able to understand and follow instructions,  This screen was done by Donell SievertAnna Shell RN  ED nurse,  This test was performed by me as per my charge nurse Concepcion Livingammy Campbell RN

## 2013-12-16 NOTE — Consult Note (Signed)
Reason for Consult: Confusion / syncope Referring Physician: Dr Laural BenesJohnson    HPI: [name] is an 80 y.o. male  with a long history of Parkinson's disease who currently lives alone after having lost his wife approximately 3 years ago. The patient has supportive children who live nearby. They have noted a gradual decline in the patient for some time. It seems he's gotten significantly worse since December. He has been having more and more confusion and he has had several falls but no significant injuries. He has been treated with Haldol and more recently Seroquel.; however, these have not significantly improved the situation. The history was obtained from Dylan GelinasRhonda [name] his daughter at [phone]. The patient was too confused to contribute any useful information.  The patient recently wandered outside the house in his underwear during extremely cold weather. Fortunately he was able to get back inside. Yesterday morning he was found on the floor of his house by his son. It appeared that the patient had fallen and could not get up. When helped to his feet the patient appeared to be unable to stand. This was unusual as the patient is normally quite mobile. He was also noted to be more confused. Usually his daughter is able to reorient him; however, this time he did not seem to come around.  He also appears to be having hallucinations where he reaches above his head for something and attempts to place it in his mouth. The patient's daughter also reported possible seizure activity on several occasions during which time his body would shake for 5-10 seconds at a time. He was brought to the emergency room for today further evaluation. The family feels that he can no longer live alone.  Past Medical History  Diagnosis Date  . HTN (hypertension)   . Hyperlipemia   . Gait abnormality   . Macular degeneration   . Parkinson disease   . CAD (coronary atherosclerotic disease)   . S/P CABG (coronary artery bypass  graft)   . Type 2 diabetes mellitus   . Anemia   . BPH (benign prostatic hyperplasia)   . Physical deconditioning   . CHF (congestive heart failure)     Past Surgical History  Procedure Laterality Date  . Coronary artery bypass graft      Family History  Problem Relation Age of Onset  . Hypertension      Social History:  reports that he has quit smoking. His smoking use included Cigarettes. He smoked 0.00 packs per day. He has never used smokeless tobacco. He reports that he does not drink alcohol or use illicit drugs.  Allergies  Allergen Reactions  . Tylox [Oxycodone-Acetaminophen] Other (See Comments)    Behavioral issues    Medications:  Scheduled: . aspirin EC  81 mg Oral Daily  . Carbidopa-Levodopa ER  1 tablet Oral TID  . docusate sodium  100 mg Oral BID  . [START ON 12/17/2013] furosemide  20 mg Oral Daily  . heparin  5,000 Units Subcutaneous Q8H  . insulin aspart  0-15 Units Subcutaneous TID WC  . lisinopril  10 mg Oral Daily  . metoprolol  50 mg Oral BID  . pantoprazole  40 mg Oral BID  . potassium chloride  10 mEq Oral Daily  . QUEtiapine  25 mg Oral QHS  . simvastatin  20 mg Oral QHS  . sodium chloride  3 mL Intravenous Q12H  . tamsulosin  0.4 mg Oral Daily    ROS: Unobtainable secondary to confusion  Physical Examination: Blood pressure 148/63, pulse 100, temperature 98.8 F (37.1 C), temperature source Axillary, resp. rate 16, height 5\' 9"  (1.753 m), SpO2 99.00%.  General - 80 year old male in bed with eyes closed who appears to be hallucinating. Heart - Regular rate and rhythm  Lungs - Clear to auscultation anteriorly Abdomen - Soft - non tender Extremities - distal pulses weak to absent. 2+ pitting edema bilaterally Skin - Warm and dry   Neurologic Examination Mental Status: The patient is alert but confused. He will occasionally follow some simple commands. He will attempt to answer some questions. No speech deficits were noted Cranial  Nerves: II: Discs not visualized; Visual fields grossly normal, pupils equal, round, reactive to light and accommodation III,IV, VI: ptosis not present, extra-ocular motions intact bilaterally V,VII: smile symmetric, facial light touch sensation normal bilaterally VIII: hearing normal bilaterally IX,X: gag reflex present XI: bilateral shoulder shrug XII: midline tongue extension Motor: Right : Upper extremity   5/5    Left:     Upper extremity   5/5  Lower extremity   3/5     Lower extremity   2/5 Tone and bulk:normal tone throughout; no atrophy noted Sensory: Sensation appears intact to light touch Deep Tendon Reflexes: 2+ and symmetric throughout Plantars: Right: downgoing   Left: downgoing Cerebellar: The patient was unable to bring his finger to his nose - he did not seem to know where his nose was located. Gait: Deferred   Laboratory Studies:   Basic Metabolic Panel:  Recent Labs Lab 12/16/13 1030  NA 141  K 4.6  CL 107  CO2 20  GLUCOSE 121*  BUN 44*  CREATININE 2.97*  CALCIUM 8.9    Liver Function Tests:  Recent Labs Lab 12/16/13 1030  AST 37  ALT <5  ALKPHOS 99  BILITOT 0.8  PROT 6.9  ALBUMIN 3.8   No results found for this basename: LIPASE, AMYLASE,  in the last 168 hours No results found for this basename: AMMONIA,  in the last 168 hours  CBC:  Recent Labs Lab 12/16/13 1030  WBC 6.9  NEUTROABS 5.1  HGB 11.2*  HCT 34.0*  MCV 87.6  PLT 116*    Cardiac Enzymes:  Recent Labs Lab 12/16/13 1030 12/16/13 1514  TROPONINI <0.30 <0.30    BNP: No components found with this basename: POCBNP,   CBG:  Recent Labs Lab 12/16/13 1617  GLUCAP 116*    Microbiology: Results for orders placed during the hospital encounter of 07/18/07  CULTURE, BLOOD (ROUTINE X 2)     Status: None   Collection Time    07/18/07  1:55 PM      Result Value Range Status   Specimen Description BLOOD RIGHT HAND   Final   Special Requests BOTTLES DRAWN AEROBIC  AND ANAEROBIC 5CC   Final   Culture NO GROWTH 5 DAYS   Final   Report Status 07/24/2007 FINAL   Final  CULTURE, BLOOD (ROUTINE X 2)     Status: None   Collection Time    07/18/07  2:00 PM      Result Value Range Status   Specimen Description BLOOD LEFT ARM   Final   Special Requests BOTTLES DRAWN AEROBIC AND ANAEROBIC 5CC   Final   Culture NO GROWTH 5 DAYS   Final   Report Status 07/24/2007 FINAL   Final    Coagulation Studies: No results found for this basename: LABPROT, INR,  in the last 72 hours  Urinalysis:  Recent  Labs Lab 12/16/13 1008  COLORURINE YELLOW  LABSPEC 1.016  PHURINE 5.5  GLUCOSEU NEGATIVE  HGBUR NEGATIVE  BILIRUBINUR NEGATIVE  KETONESUR NEGATIVE  PROTEINUR NEGATIVE  UROBILINOGEN 0.2  NITRITE NEGATIVE  LEUKOCYTESUR NEGATIVE    Lipid Panel:  No results found for this basename: chol, trig, hdl, cholhdl, vldl, ldlcalc    HgbA1C:  Lab Results  Component Value Date   HGBA1C 7.0* 05/02/2013    Urine Drug Screen:   No results found for this basename: labopia, cocainscrnur, labbenz, amphetmu, thcu, labbarb    Alcohol Level: No results found for this basename: ETH,  in the last 168 hours  Other results: EKG: Possible ectopic atrial rhythm - final reading is pending  Imaging:  Dg Chest 2 View 12/16/2013    No acute cardiopulmonary findings.    Dg Pelvis 1-2 Views 12/16/2013    No acute bony findings.      Ct Head Wo Contrast 12/16/2013 1. No acute intracranial abnormality. Moderate cerebral atrophy. Mild periventricular white matter decreased attenuation probable due to chronic small vessel ischemic changes. 2. No cervical spine acute fracture or subluxation. Degenerative changes as described above.     Ct Cervical Spine Wo Contrast 12/16/2013    1. No acute intracranial abnormality. Moderate cerebral atrophy. Mild periventricular white matter decreased attenuation probable due to chronic small vessel ischemic changes. 2. No cervical spine acute  fracture or subluxation. Degenerative changes as described above.        Assessment/Plan:  This is an 80 year old male with a history of Parkinson's disease and recent increase in confusion with hallucinations and possible seizure-like activity. As discussed with Dr. Roseanne Reno we will order an MRI and an EEG for further evaluation.  Delton See PA-C [facility] Pager [phone] 12/16/2013, 5:44 PM  I personally participate in this patient's evaluation and management, including formulating the above clinical assessment and management recommendations.  Venetia Maxon M.D. [facility] [phone]

## 2013-12-16 NOTE — ED Provider Notes (Signed)
CSN: [id number]     Arrival date & time 12/16/13  [id number] History   First MD Initiated Contact with Patient 12/16/13 0915     Chief Complaint  Patient presents with  . Weakness   (Consider location/radiation/quality/duration/timing/severity/associated sxs/prior Treatment) Patient is a 80 y.o. male presenting with fall. The history is provided by the patient and the EMS personnel.  Fall This is a new problem. The current episode started yesterday. Episode frequency: once. The problem has been resolved. Pertinent negatives include no chest pain, no abdominal pain, no headaches and no shortness of breath. Nothing aggravates the symptoms. Nothing relieves the symptoms. He has tried nothing for the symptoms. The treatment provided no relief.    Past Medical History  Diagnosis Date  . HTN (hypertension)   . Hyperlipemia   . Gait abnormality    Past Surgical History  Procedure Laterality Date  . None     No family history on file. History  Substance Use Topics  . Smoking status: Former Smoker    Types: Cigarettes  . Smokeless tobacco: Never Used     Comment: Patient Quit 65 years ago.  . Alcohol Use: No    Review of Systems  Constitutional: Negative for fever.  HENT: Negative for drooling and rhinorrhea.   Eyes: Negative for pain.  Respiratory: Negative for cough and shortness of breath.   Cardiovascular: Negative for chest pain and leg swelling.  Gastrointestinal: Negative for nausea, vomiting, abdominal pain and diarrhea.  Genitourinary: Negative for dysuria and hematuria.  Musculoskeletal: Negative for gait problem and neck pain.  Skin: Negative for color change.  Neurological: Negative for numbness and headaches.  Hematological: Negative for adenopathy.  Psychiatric/Behavioral: Negative for behavioral problems.  All other systems reviewed and are negative.    Allergies  Tylox  Home Medications   Current Outpatient Rx  Name  Route  Sig  Dispense  Refill  . aspirin 81  MG tablet   Oral   Take 81 mg by mouth daily.         . carbidopa-levodopa (SINEMET) 25-100 MG per tablet   Oral   Take 1 tablet by mouth 3 (three) times daily.   90 tablet   12   . furosemide (LASIX) 40 MG tablet      Take one tablets (40 mg) alternating to 1/2 (20 mg) tablets as directed every other day.   45 tablet   6   . glipiZIDE (GLUCOTROL) 10 MG tablet   Oral   Take 10 mg by mouth 2 (two) times daily.          Marland Kitchen. KLOR-CON M10 10 MEQ tablet               . lisinopril (PRINIVIL,ZESTRIL) 20 MG tablet   Oral   Take 20 mg by mouth daily. Take 1/2 tab daily         . metoprolol succinate (TOPROL-XL) 50 MG 24 hr tablet   Oral   Take 50 mg by mouth daily. 1/2  Take with or immediately following a meal.         . Multiple Vitamins-Minerals (OCUVITE PO)   Oral   Take by mouth.         . pantoprazole (PROTONIX) 40 MG tablet   Oral   Take 40 mg by mouth daily.         . Pioglitazone HCl (ACTOS PO)   Oral   Take by mouth daily.         .Marland Kitchen  potassium chloride (KLOR-CON 10) 10 MEQ tablet   Oral   Take 10 mEq by mouth daily.          . QUEtiapine (SEROQUEL) 25 MG tablet      Starting at 1/2 tab po qhs, if he still has agitation, we may increase to one tab po qhs, maximum 2 tabs qhs   60 tablet   12   . simvastatin (ZOCOR) 20 MG tablet   Oral   Take 20 mg by mouth at bedtime.         . tamsulosin (FLOMAX) 0.4 MG CAPS   Oral   Take 0.4 mg by mouth daily.          BP 137/63  Temp(Src) 98.8 F (37.1 C) (Oral)  Resp 18  SpO2 97% Physical Exam  Nursing note and vitals reviewed. Constitutional: He is oriented to person, place, and time. He appears well-developed and well-nourished.  HENT:  Head: Normocephalic and atraumatic.  Right Ear: External ear normal.  Left Ear: External ear normal.  Nose: Nose normal.  Mouth/Throat: Oropharynx is clear and moist. No oropharyngeal exudate.  Eyes: Conjunctivae and EOM are normal. Pupils are equal,  round, and reactive to light.  Neck: Normal range of motion. Neck supple.  No vertebral tenderness to palpation.  Cardiovascular: Normal rate, regular rhythm, normal heart sounds and intact distal pulses.  Exam reveals no gallop and no friction rub.   No murmur heard. Pulmonary/Chest: Effort normal and breath sounds normal. No respiratory distress. He has no wheezes.  Abdominal: Soft. Bowel sounds are normal. He exhibits no distension. There is no tenderness. There is no rebound and no guarding.  Genitourinary:  Mild to moderate maceration of the skin folds in the groin area bilaterally.  Musculoskeletal: Normal range of motion. He exhibits edema (mild edema in bilateral LE's). He exhibits no tenderness.  Neurological: He is alert and oriented to person, place, and time. GCS eye subscore is 3. GCS verbal subscore is 5. GCS motor subscore is 6.  2/5 strength in all extremities.   Sensation appears to be intact.  Gait exam deferred due to significant generalized weakness.    Skin: Skin is warm and dry.  Moderate sized pendulous soft tissue growth on the superior right shoulder.  Psychiatric: He has a normal mood and affect. His behavior is normal.    ED Course  Procedures (including critical care time) Labs Review Labs Reviewed  CBC WITH DIFFERENTIAL - Abnormal; Notable for the following:    RBC 3.88 (*)    Hemoglobin 11.2 (*)    HCT 34.0 (*)    RDW 16.0 (*)    Platelets 116 (*)    All other components within normal limits  COMPREHENSIVE METABOLIC PANEL - Abnormal; Notable for the following:    Glucose, Bld 121 (*)    BUN 44 (*)    Creatinine, Ser 2.97 (*)    GFR calc non Af Amer 17 (*)    GFR calc Af Amer 20 (*)    All other components within normal limits  URINALYSIS W MICROSCOPIC + REFLEX CULTURE - Abnormal; Notable for the following:    APPearance CLOUDY (*)    Casts HYALINE CASTS (*)    All other components within normal limits  TROPONIN I   Imaging Review Dg Chest  2 View  12/16/2013   CLINICAL DATA:  Larey Seat.  EXAM: CHEST  2 VIEW  COMPARISON:  07/06/2007.  FINDINGS: The heart is enlarged but stable. There are stable  surgical changes from triple bypass surgery. There are chronic bronchitic type interstitial lung changes but no acute pulmonary findings. No pleural effusion or pneumothorax. The bony thorax is intact.  IMPRESSION: No acute cardiopulmonary findings.   Electronically Signed   By: Loralie Champagne M.D.   On: 12/16/2013 10:41   Dg Pelvis 1-2 Views  12/16/2013   CLINICAL DATA:  Larey Seat.  Pelvic pain.  EXAM: PELVIS - 1-2 VIEW  COMPARISON:  CT scan 06/29/2007.  FINDINGS: Both hips are normally located mild degenerative changes. No acute fracture. The pubic symphysis SI joints are intact. No definite pelvic fractures.  IMPRESSION: No acute bony findings.   Electronically Signed   By: Loralie Champagne M.D.   On: 12/16/2013 10:40   Ct Head Wo Contrast  12/16/2013   CLINICAL DATA:  Fall, history of Parkinson's dementia  EXAM: CT HEAD WITHOUT CONTRAST  CT CERVICAL SPINE WITHOUT CONTRAST  TECHNIQUE: Multidetector CT imaging of the head and cervical spine was performed following the standard protocol without intravenous contrast. Multiplanar CT image reconstructions of the cervical spine were also generated.  COMPARISON:  09/06/2007  FINDINGS: CT HEAD FINDINGS  No skull fracture is noted. Paranasal sinuses and mastoid air cells are unremarkable.  No intracranial hemorrhage, mass effect or midline shift. Moderate cerebral atrophy. Mild periventricular white matter decreased attenuation probable due to chronic small vessel ischemic changes. Atherosclerotic calcifications of carotid siphon. No acute cortical infarction. No mass lesion is noted on this unenhanced scan.  CT CERVICAL SPINE FINDINGS  Axial images of the cervical spine shows no acute fracture or subluxation. There is bony fusion of C4-C5 vertebral body. Mild anterior and posterior spurring at C4-C5 level. Disc space  flattening with mild anterior and mild posterior spurring at C5-C6 and C6-C7 level. Mild anterior spurring at C7-T1 level.  Degenerative changes are noted C1-C2 articulation. No prevertebral soft tissue swelling.  Multilevel facet degenerative changes are noted.  Bilateral apical scarring is noted. No evidence of pneumothorax in lung apices.  IMPRESSION: 1. No acute intracranial abnormality. Moderate cerebral atrophy. Mild periventricular white matter decreased attenuation probable due to chronic small vessel ischemic changes. 2. No cervical spine acute fracture or subluxation. Degenerative changes as described above.   Electronically Signed   By: Natasha Mead M.D.   On: 12/16/2013 10:10   Ct Cervical Spine Wo Contrast  12/16/2013   CLINICAL DATA:  Fall, history of Parkinson's dementia  EXAM: CT HEAD WITHOUT CONTRAST  CT CERVICAL SPINE WITHOUT CONTRAST  TECHNIQUE: Multidetector CT imaging of the head and cervical spine was performed following the standard protocol without intravenous contrast. Multiplanar CT image reconstructions of the cervical spine were also generated.  COMPARISON:  09/06/2007  FINDINGS: CT HEAD FINDINGS  No skull fracture is noted. Paranasal sinuses and mastoid air cells are unremarkable.  No intracranial hemorrhage, mass effect or midline shift. Moderate cerebral atrophy. Mild periventricular white matter decreased attenuation probable due to chronic small vessel ischemic changes. Atherosclerotic calcifications of carotid siphon. No acute cortical infarction. No mass lesion is noted on this unenhanced scan.  CT CERVICAL SPINE FINDINGS  Axial images of the cervical spine shows no acute fracture or subluxation. There is bony fusion of C4-C5 vertebral body. Mild anterior and posterior spurring at C4-C5 level. Disc space flattening with mild anterior and mild posterior spurring at C5-C6 and C6-C7 level. Mild anterior spurring at C7-T1 level.  Degenerative changes are noted C1-C2 articulation. No  prevertebral soft tissue swelling.  Multilevel facet degenerative changes are noted.  Bilateral apical scarring is noted. No evidence of pneumothorax in lung apices.  IMPRESSION: 1. No acute intracranial abnormality. Moderate cerebral atrophy. Mild periventricular white matter decreased attenuation probable due to chronic small vessel ischemic changes. 2. No cervical spine acute fracture or subluxation. Degenerative changes as described above.   Electronically Signed   By: Natasha Mead M.D.   On: 12/16/2013 10:10    EKG Interpretation    Date/Time:  Saturday December 16 2013 09:22:09 EST Ventricular Rate:  90 PR Interval:  71 QRS Duration: 116 QT Interval:  376 QTC Calculation: 460 R Axis:   -60 Text Interpretation:  Sinus or ectopic atrial rhythm Short PR interval Incomplete right bundle branch block Anterolateral infarct, age indeterminate Confirmed by Gracey Tolle  MD, Jossue Rubenstein ([id number]) on 12/16/2013 9:45:25 AM            MDM   1. Fall, initial encounter   2. Altered mental status   3. Hyperlipemia   4. HTN (hypertension)   5. Gait abnormality   6. Macular degeneration   7. Parkinson disease   8. CAD (coronary atherosclerotic disease)   9. S/P CABG (coronary artery bypass graft)   10. Type 2 diabetes mellitus   11. Anemia   12. BPH (benign prostatic hyperplasia)   13. Physical deconditioning    10:00 AM 80 y.o. male who presents after self reported episode of syncope. Per EMS the patient was found down beside his bed yesterday. He was placed in a chair by his family and then found again in the same chair today. 3 mass he was sent for this reason. The patient is stating that he felt some pressure in his head while ambulating and syncopized falling and hitting his head. He is stating that this happened today. The patient does live by himself. He is alert and oriented x3 here to give some confused responses during my discussion with him. He is afebrile and vital signs are unremarkable. No  evidence of obvious traumatic injury. He is a poor historian. He denies any pain on my exam. Will get screening imaging and labs.  Family noting worsening confusion, possibly post concussive. No injury noted on imaging. Will admit to [facility].     Junius Argyle, MD 12/16/13 1324

## 2013-12-16 NOTE — ED Notes (Signed)
EMS reports pt from home, he lives alone, son and daughter of pt found pt on floor in hallway approx 1000 Jan 9.  Family was able to assist pt to recliner and pt had good appetite but then pt began to decrease in level of consciousness and pt was aggressive with family (normally never) during the night.  Pt normally lives at home and ambulates with cane, but does not perform ADL such as cooking, son and daughter report pt has aversion to bathing but normally does not have incontinence.

## 2013-12-16 NOTE — Progress Notes (Signed)
Per report pt with clear CT scan and bedside swallow was ordered due to altered mental status possibly related to parkinson's medications. Pt is alert and following commands to open eyes. Neuro consult is ordered for possible seizures, and parkinson's concerns, again report was not to rule out stroke. Pt moving all extremities, and no facial droop, speech clear-pt not oriented but per family this has been steady decline that escalated yesterday with fall. See MD note.   Will follow closely and await further orders.

## 2013-12-16 NOTE — Progress Notes (Signed)
Call to get report placed on hold by Tresa EndoKelly, no answer. Will call back.

## 2013-12-16 NOTE — ED Notes (Signed)
Bed: [id number] Expected date:  Expected time:  Means of arrival:  Comments: EMS- Elderly weakness

## 2013-12-16 NOTE — H&P (Signed)
History and Physical Examination  [name] XBJ:[id number] DOB: 1922-06-16 DOA: 12/16/2013  Referring physician: Tiburcio PeaHarris  PCP: Delorse LekBURNETT,BRENT A, MD  Neurologist: Dr. Brayton ElY. Yan  Chief Complaint: Altered Mental Status   HPI: [name] is a 80 y.o. male with Parkinson's Disease and past medical history of hypertension, diabetes, chronic kidney disease, coronary artery disease, hyperlipidemia, and benign prostate hypertrophy. He has responded well to Sinemet and has been on this for years His initial presentation was resting tremor, and gait difficulty, consistent with mild Parkinson's disease,  He is taking Sinemet 25/100 mg 3 times a day with improvement in gait.  Pt has had multiple falls at home but his physical deconditioning and falls have increased over the past 6 months.  He has had increasing confusion and hallucinations over the past several months.   He is also having episodes of spastic activity involving the upper and lower extremities.  He doesn't have a history of epilepsy or previous seizure.  He is taking anti-hypoglycemics for type 2 diabetes mellitus.  The patient had been put on Hadol 5mg  qhs by his primary care physician in 2013, which ha been helpful, but recently this was discontinued by his neurologist in Dec 2014 and started on seroquel 25 mg QHS.  The patient had been living alone but his children check in on him everyday.  They report that the patient's recent worsening confusion has caused him great concern.  They report that the patient had been walking outside in his underwear and freezing temperatures.  On the day prior to admission, the patient was found down at home on the floor after having sustained a fall.  They report that they feel it is not safe for him to be at home.  They're concerned about his syncopal episodes and increasing confusion.  The patient was evaluated in the emergency department and found to be confused and disoriented.  A hospitalization was  requested for further evaluation and management.  Past Medical History Past Medical History  Diagnosis Date  . HTN (hypertension)   . Hyperlipemia   . Gait abnormality   . Macular degeneration   . Parkinson disease   . CAD (coronary atherosclerotic disease)   . S/P CABG (coronary artery bypass graft)   . Type 2 diabetes mellitus   . Anemia   . BPH (benign prostatic hyperplasia)   . Physical deconditioning   . CHF (congestive heart failure)    Past Surgical History Past Surgical History  Procedure Laterality Date  . Coronary artery bypass graft     Home Meds: Prior to Admission medications   Medication Sig Start Date End Date Taking? Authorizing Provider  aspirin EC 81 MG tablet Take 81 mg by mouth daily.   Yes Historical Provider, MD  Carbidopa-Levodopa ER (SINEMET CR) 25-100 MG tablet controlled release Take 1 tablet by mouth 3 (three) times daily.   Yes Historical Provider, MD  furosemide (LASIX) 40 MG tablet Take 20-40 mg by mouth See admin instructions. Take one tablets (40 mg) alternating to 1/2 (20 mg) tablets as directed every other day.  Sunday-20 mg, Monday-20mg , etc. 09/19/13  Yes Runell GessJonathan J Berry, MD  glipiZIDE (GLUCOTROL) 10 MG tablet Take 10 mg by mouth 2 (two) times daily.    Yes Historical Provider, MD  lisinopril (PRINIVIL,ZESTRIL) 20 MG tablet Take 10 mg by mouth daily. Take 1/2 tab daily   Yes Historical Provider, MD  metoprolol (LOPRESSOR) 50 MG tablet Take 50 mg by mouth 2 (two) times daily.  11/20/13  Yes Historical Provider, MD  pantoprazole (PROTONIX) 40 MG tablet Take 40 mg by mouth 2 (two) times daily.    Yes Historical Provider, MD  pioglitazone (ACTOS) 30 MG tablet Take 30 mg by mouth daily.   Yes Historical Provider, MD  potassium chloride (KLOR-CON 10) 10 MEQ tablet Take 10 mEq by mouth daily.    Yes Historical Provider, MD  QUEtiapine (SEROQUEL) 25 MG tablet Take 25 mg by mouth at bedtime.   Yes Historical Provider, MD  simvastatin (ZOCOR) 20 MG  tablet Take 20 mg by mouth at bedtime.   Yes Historical Provider, MD  tamsulosin (FLOMAX) 0.4 MG CAPS Take 0.4 mg by mouth daily.   Yes Historical Provider, MD   Allergies: Tylox  Social History:  History   Social History  . Marital Status: Married    Spouse Name: N/A    Number of Children: N/A  . Years of Education: N/A   Occupational History  . retired    Social History Main Topics  . Smoking status: Former Smoker    Types: Cigarettes  . Smokeless tobacco: Never Used     Comment: Patient Quit 65 years ago.  . Alcohol Use: No  . Drug Use: No  . Sexual Activity: No   Other Topics Concern  . Not on file   Social History Narrative   Patient lives alone.    Patient wife passed away 2010-11-10.   Patient drinks 2 cups of coffee daily.    Patient quit tobacco in 1951.    Family History:  Family History  Problem Relation Age of Onset  . Hypertension     Review of Systems  Constitutional: Negative for fever, chills, weight loss, malaise/fatigue and diaphoresis.  HENT: Negative for ear discharge and hearing loss.   Eyes: Negative.   Cardiovascular: Negative.   Gastrointestinal: Negative.  Negative for heartburn, vomiting and abdominal pain.  Genitourinary: Negative for dysuria and frequency.  Skin: Negative for itching and rash.  Neurological: Positive for focal weakness, loss of consciousness and weakness. Negative for dizziness, tremors and headaches.  Psychiatric/Behavioral: Positive for hallucinations and memory loss.  All other systems reviewed and are negative.  Physical Exam: Blood pressure 144/65, pulse 101, temperature 97.9 F (36.6 C), temperature source Oral, resp. rate 19, SpO2 97.00%. General appearance: alert, cooperative, appears stated age and no distress Head: Normocephalic, without obvious abnormality, atraumatic Eyes: negative findings: lids and lashes normal, conjunctivae and sclerae normal, corneas clear and pupils equal, round, reactive to light  and accomodation Nose: Nares normal. Septum midline. Mucosa normal. No drainage or sinus tenderness., no discharge Throat: dry mucous membranes Neck: no adenopathy, no carotid bruit, no JVD, supple, symmetrical, trachea midline and thyroid not enlarged, symmetric, no tenderness/mass/nodules Back: negative Lungs: clear to auscultation bilaterally and normal percussion bilaterally Chest wall: no tenderness Heart: S1, S2 normal Abdomen: soft, non-tender; bowel sounds normal; no masses,  no organomegaly and mild distension Extremities: edema 1+pitting edema bilateral LEs Pulses: 2+ and symmetric Skin: Skin color, texture, turgor normal. No rashes or lesions Lymph nodes: Cervical, supraclavicular, and axillary nodes normal. Neurologic: confused, hallucinating, moving all extremities  Lab  And Imaging results:  Results for orders placed during the hospital encounter of 12/16/13 (from the past 24 hour(s))  URINALYSIS W MICROSCOPIC + REFLEX CULTURE     Status: Abnormal   Collection Time    12/16/13 10:08 AM      Result Value Range   Color, Urine YELLOW  YELLOW  APPearance CLOUDY (*) CLEAR   Specific Gravity, Urine 1.016  1.005 - 1.030   pH 5.5  5.0 - 8.0   Glucose, UA NEGATIVE  NEGATIVE mg/dL   Hgb urine dipstick NEGATIVE  NEGATIVE   Bilirubin Urine NEGATIVE  NEGATIVE   Ketones, ur NEGATIVE  NEGATIVE mg/dL   Protein, ur NEGATIVE  NEGATIVE mg/dL   Urobilinogen, UA 0.2  0.0 - 1.0 mg/dL   Nitrite NEGATIVE  NEGATIVE   Leukocytes, UA NEGATIVE  NEGATIVE   WBC, UA 3-6  <3 WBC/hpf   RBC / HPF 0-2  <3 RBC/hpf   Bacteria, UA RARE  RARE   Squamous Epithelial / LPF RARE  RARE   Casts HYALINE CASTS (*) NEGATIVE   Urine-Other MUCOUS PRESENT    CBC WITH DIFFERENTIAL     Status: Abnormal   Collection Time    12/16/13 10:30 AM      Result Value Range   WBC 6.9  4.0 - 10.5 K/uL   RBC 3.88 (*) 4.22 - 5.81 MIL/uL   Hemoglobin 11.2 (*) 13.0 - 17.0 g/dL   HCT 16.1 (*) 09.6 - 04.5 %   MCV 87.6  78.0  - 100.0 fL   MCH 28.9  26.0 - 34.0 pg   MCHC 32.9  30.0 - 36.0 g/dL   RDW 40.9 (*) 81.1 - 91.4 %   Platelets 116 (*) 150 - 400 K/uL   Neutrophils Relative % 73  43 - 77 %   Lymphocytes Relative 15  12 - 46 %   Monocytes Relative 10  3 - 12 %   Eosinophils Relative 2  0 - 5 %   Basophils Relative 0  0 - 1 %   Neutro Abs 5.1  1.7 - 7.7 K/uL   Lymphs Abs 1.0  0.7 - 4.0 K/uL   Monocytes Absolute 0.7  0.1 - 1.0 K/uL   Eosinophils Absolute 0.1  0.0 - 0.7 K/uL   Basophils Absolute 0.0  0.0 - 0.1 K/uL   Smear Review MORPHOLOGY UNREMARKABLE    COMPREHENSIVE METABOLIC PANEL     Status: Abnormal   Collection Time    12/16/13 10:30 AM      Result Value Range   Sodium 141  137 - 147 mEq/L   Potassium 4.6  3.7 - 5.3 mEq/L   Chloride 107  96 - 112 mEq/L   CO2 20  19 - 32 mEq/L   Glucose, Bld 121 (*) 70 - 99 mg/dL   BUN 44 (*) 6 - 23 mg/dL   Creatinine, Ser 7.82 (*) 0.50 - 1.35 mg/dL   Calcium 8.9  8.4 - 95.6 mg/dL   Total Protein 6.9  6.0 - 8.3 g/dL   Albumin 3.8  3.5 - 5.2 g/dL   AST 37  0 - 37 U/L   ALT <5  0 - 53 U/L   Alkaline Phosphatase 99  39 - 117 U/L   Total Bilirubin 0.8  0.3 - 1.2 mg/dL   GFR calc non Af Amer 17 (*) >90 mL/min   GFR calc Af Amer 20 (*) >90 mL/min  TROPONIN I     Status: None   Collection Time    12/16/13 10:30 AM      Result Value Range   Troponin I <0.30  <0.30 ng/mL    Impression / Plan   Fall / possible syncopal episode - The patient has been having multiple falls recently but the most recent fall was unwitnessed.  The patient was found down  at home.  The patient is going to be admitted for workup.  He had a recent echocardiogram approximately 6 months ago that was unrevealing.  - Check orthostatic vital signs - rehydrate with gentle IV fluids - CT scan shows no acute findings    Parkinson's Disease / Abnormality of gait - Patient is a high fall risk at this time -Continue Sinemet as prescribed by neurologist -PT/OT evaluation   Possible  Seizures / Involuntary Spastic Movements - Consult inpatient neurology for evaluation and management recommendations   Abnormal involuntary movements (781.0) -These movements have recently started and concerning for seizure versus side effect of recently started Seroquel -Will ask the neurology service to evaluate  Altered mental status /   Dementia - We'll continue to workup as above for acute causes - Initiating infectious workup - Fall precautions - Pt is at high risk for delerium  HTN (hypertension) - resume home medications   Hyperlipemia  CAD (coronary atherosclerotic disease) /  S/P CABG (coronary artery bypass graft) - cycle troponin to rule out acute mycocardial ischemia  Type 2 diabetes mellitus - The patient had been taking sulfonylureas which can be dangerous for an elderly patient with chronic kidney disease, will discontinue sulfonylurea treatment, monitor blood glucose closely, DC actos while inpatient, provide supplemental insulin as needed to treat high blood glucose readings - There is a possibility that the patient had been having hypoglycemic events at home causing him to pass out  Anemia - from chronic kidney disease  - following - baseline hemoglobin reported as 10  Physical deconditioning - PT eval pending - OT eval pending Given the patient's acute decompensation it is likely he will not be able to safely return home I going to ask the social worker for skilled nursing home placement    CRF (chronic renal failure) - will follow electrolytes and metabolic panel     Dehydration - gentle IVFs - Monitor I/Os - daily weights  CHF (congestive heart failure) - Monitor IVFs, I/Os, Weights - recent ECHO 7 months ago, EF 50%.  Code Status:  I had a discussion with the patient's son and daughter who reported to me that they felt strongly that the patient has expressed to them clearly that he would like to be DO NOT RESUSCITATE/DO NOT INTUBATE.  We will  respect his wishes at this time.  Family Communication: Updated at bedside son/daughter  Standley Dakins MD [facility] [place], [place] [phone] 12/16/2013, 1:56 PM

## 2013-12-17 DIAGNOSIS — R259 Unspecified abnormal involuntary movements: Secondary | ICD-10-CM

## 2013-12-17 DIAGNOSIS — W19XXXA Unspecified fall, initial encounter: Secondary | ICD-10-CM

## 2013-12-17 DIAGNOSIS — R269 Unspecified abnormalities of gait and mobility: Secondary | ICD-10-CM

## 2013-12-17 LAB — GLUCOSE, CAPILLARY
GLUCOSE-CAPILLARY: 111 mg/dL — AB (ref 70–99)
Glucose-Capillary: 87 mg/dL (ref 70–99)
Glucose-Capillary: 88 mg/dL (ref 70–99)
Glucose-Capillary: 121 mg/dL — ABNORMAL HIGH (ref 70–99)
Glucose-Capillary: 89 mg/dL (ref 70–99)
Glucose-Capillary: 138 mg/dL — ABNORMAL HIGH (ref 70–99)
Glucose-Capillary: 157 mg/dL — ABNORMAL HIGH (ref 70–99)

## 2013-12-17 LAB — COMPREHENSIVE METABOLIC PANEL
AST: 54 U/L — ABNORMAL HIGH (ref 0–37)
Albumin: 3 g/dL — ABNORMAL LOW (ref 3.5–5.2)
Alkaline Phosphatase: 80 U/L (ref 39–117)
BUN: 40 mg/dL — ABNORMAL HIGH (ref 6–23)
CALCIUM: 8.2 mg/dL — AB (ref 8.4–10.5)
CO2: 17 mEq/L — ABNORMAL LOW (ref 19–32)
Chloride: 112 mEq/L (ref 96–112)
Creatinine, Ser: 2.48 mg/dL — ABNORMAL HIGH (ref 0.50–1.35)
GFR calc non Af Amer: 22 mL/min — ABNORMAL LOW (ref >90)
GFR, EST AFRICAN AMERICAN: 25 mL/min — AB (ref >90)
Glucose, Bld: 89 mg/dL (ref 70–99)
Potassium: 4.6 mEq/L (ref 3.7–5.3)
SODIUM: 142 meq/L (ref 137–147)
TOTAL PROTEIN: 5.8 g/dL — AB (ref 6.0–8.3)
Total Bilirubin: 0.9 mg/dL (ref 0.3–1.2)

## 2013-12-17 LAB — TROPONIN I

## 2013-12-17 LAB — CBC
HCT: 30.4 % — ABNORMAL LOW (ref 39.0–52.0)
Hemoglobin: 10.1 g/dL — ABNORMAL LOW (ref 13.0–17.0)
MCH: 28.9 pg (ref 26.0–34.0)
MCHC: 33.2 g/dL (ref 30.0–36.0)
MCV: 86.9 fL (ref 78.0–100.0)
PLATELETS: DECREASED 10*3/uL (ref 150–400)
RBC: 3.5 MIL/uL — ABNORMAL LOW (ref 4.22–5.81)
RDW: 16.2 % — AB (ref 11.5–15.5)
WBC: 7.2 10*3/uL (ref 4.0–10.5)

## 2013-12-17 MED ORDER — HYDRALAZINE HCL 10 MG PO TABS
10.0000 mg | ORAL_TABLET | Freq: Three times a day (TID) | ORAL | Status: DC
  Administered 2013-12-18 – 2013-12-19 (×3): 10 mg via ORAL
  Filled 2013-12-17 (×9): qty 1

## 2013-12-17 MED ORDER — DEXTROSE-NACL 5-0.45 % IV SOLN
INTRAVENOUS | Status: DC
  Administered 2013-12-17 (×2): via INTRAVENOUS

## 2013-12-17 MED ORDER — HYDRALAZINE HCL 25 MG PO TABS: 25.0000 mg | ORAL_TABLET | Freq: Three times a day (TID) | ORAL | Status: DC

## 2013-12-17 NOTE — Progress Notes (Signed)
Clinical Social Work Department BRIEF PSYCHOSOCIAL ASSESSMENT 12/17/2013  Patient:  [name], [name]     Account Number:  [id number]     Admit date:  12/16/2013  Clinical Social Worker:  Levie Heritage  Date/Time:  12/17/2013 03:19 PM  Referred by:  Physician  Date Referred:  12/17/2013 Referred for  SNF Placement   Other Referral:   Interview type:  Other - See comment Other interview type:   [name], [name], and other family members at bedside.    PSYCHOSOCIAL DATA Living Status:  ALONE Admitted from facility:   Level of care:   Primary support name:  [name] Primary support relationship to patient:  CHILD, ADULT Degree of support available:   strong    CURRENT CONCERNS Current Concerns  Post-Acute Placement   Other Concerns:    SOCIAL WORK ASSESSMENT / PLAN Met with Pt's family to discuss d/c plans.  [name], [name], the decision-maker.    [name] stated that they felt that Pt would need SNF but that they are concerned about his long-term care, as they feel that the will need care for the rest of his life.    Discussed with family SNF process and discussed long-term care, as it relates to a SNF discharge.    CSW provided family with a SNF list.  Family hopeful for [facility] or [facility].    CSW thanked Pt and his family for their time.   Assessment/plan status:  Psychosocial Support/Ongoing Assessment of Needs Other assessment/ plan:   Information/referral to community resources:   SNF list    PATIENT'S/FAMILY'S RESPONSE TO PLAN OF CARE: Family calm, cooperative and pleasant and realistic about Pt's medical health.    Family has come to terms with Pt needing care for the rest of his life and will explore long-term care with the SNF that they choose for Pt.    Family thanked CSW for time and assistance.   Bernita Raisin, [facility] [phone]

## 2013-12-17 NOTE — Progress Notes (Signed)
Clinical Social Work Department CLINICAL SOCIAL WORK PLACEMENT NOTE 12/17/2013  Patient:  [name],[name]  Account Number:  [id number] Admit date:  12/16/2013  Clinical Social Worker:  Doroteo GlassmanAMANDA Debrah Granderson, LCSWA  Date/time:  12/17/2013 03:24 PM  Clinical Social Work is seeking post-discharge placement for this patient at the following level of care:   SKILLED NURSING   (*CSW will update this form in Epic as items are completed)   12/17/2013  [facility]family provided with [facility] of Clinical Social Work's list of facilities offering this level of care within the geographic area requested by the patient (or if unable, by the patient's family).  12/17/2013  Patient/family informed of their freedom to choose among providers that offer the needed level of care, that participate in Medicare, Medicaid or managed care program needed by the patient, have an available bed and are willing to accept the patient.  12/17/2013  Patient/family informed of [facility]' ownership interest in [facility], as well as of the fact that they are under no obligation to receive care at this facility.  PASARR submitted to EDS on  PASARR number received from EDS on   FL2 transmitted to all facilities in geographic area requested by pt/family on  12/17/2013 FL2 transmitted to all facilities within larger geographic area on   Patient informed that his/her managed care company has contracts with or will negotiate with  certain facilities, including the following:     Patient/family informed of bed offers received:   Patient chooses bed at  Physician recommends and patient chooses bed at    Patient to be transferred to  on   Patient to be transferred to facility by   The following physician request were entered in Epic:   Additional Comments:  Providence CrosbyAmanda Ky Rumple, Theresia MajorsLCSWA Clinical Social Work [phone]

## 2013-12-17 NOTE — Progress Notes (Addendum)
Patient ID: [name], male   DOB: [birth year], 80 y.o.   MRN: [id number]  [facility] PROGRESS NOTE  NILS THOR GEX:[id number] DOB: 19-Dec-1921 DOA: 12/16/2013 PCP: Delorse Lek, MD  Brief narrative: 80 y.o. male with Parkinson's Disease, hypertension, diabetes, chronic kidney disease, coronary artery disease, hyperlipidemia, and benign prostate hypertrophy. He has responded well to Sinemet and has been on this for years. He has presented to [facility] ED for further evaluation of multiple recurrent falls, progressively worsening confusion, AMS, failure to thrive, poor oral intake. Pt apparently lives alone but children check up on him daily. They have noticed progressive FTT over the past several months.   Active Problems:   Recurrent falls - secondary to progressive failure to thrive, deconditioning and functional quadriplegia  - SLP evaluation pending, will need PT/OT evaluation as well to determine appropriate discharge plan    Altered mental status - acute on chronic encephalopathy - secondary to progressive FTT and poor oral intake, ? Seizure activity - MRI and EEG ordered per neurology recommendations  - management as noted above    Acute on chronic renal failure - secondary to poor oral intake and dehydration - SLP evaluation pending but will keep NPO for now - place on IVF and repeat BMP in AM    HTN (hypertension) - hold Lasix and Lisinopril secondary to clinically dehydrated state and ARF - place on Hydralazine instead for BP control and continue Metoprolol    Parkinson disease - continue Sinemet, appreciate neurology input    CAD (coronary atherosclerotic disease), s/p CABG - clinically stable    Type 2 diabetes mellitus - continue SSI for now until SLP done and pt's oral intake improves    Anemia of chronic disease - slight drop in Hg over 24 hours - no signs of active bleed - CBC in AM   Dementia - progressive PT/OT evaluation pending    CHF (congestive  heart failure - pt clinically dry, hold Lasix and place on IVF gentle hydration - daily weights, I's and O's   Moderate malnutrition - secondary to acute on chronic progressive illnesses - SLP pending   Consultants:  Neurology   Procedures/Studies: Dg Chest 2 View  12/16/2013   No acute cardiopulmonary findings.    Dg Pelvis 1-2 Views   12/16/2013   No acute bony findings.  Ct Head Wo Contrast   12/16/2013  No acute intracranial abnormality. Moderate cerebral atrophy. Mild periventricular white matter decreased attenuation probable due to chronic small vessel ischemic changes. No cervical spine acute fracture or subluxation.   Antibiotics:  None  Code Status: DNR Family Communication: No family at bedside  Disposition Plan: will likely need SNF   HPI/Subjective: No events overnight.   Objective: Filed Vitals:   12/16/13 1414 12/16/13 1455 12/16/13 2234 12/17/13 0537  BP:  148/63 145/65 149/68  Pulse: 98 100 100 100  Temp:  98.8 F (37.1 C) 98.4 F (36.9 C) 98.3 F (36.8 C)  TempSrc:  Axillary Axillary Axillary  Resp: 19 16 17 18   Height:  5\' 9"  (1.753 m)    Weight:   94.121 kg (207 lb 8 oz)   SpO2: 97% 99% 99% 99%    Intake/Output Summary (Last 24 hours) at 12/17/13 1223 Last data filed at 12/17/13 0955  Gross per 24 hour  Intake    705 ml  Output    500 ml  Net    205 ml    Exam:   General:  Pt is alert  and orient to name only, follows some commands appropriately, not in acute distress, HOH  Cardiovascular: Regular rate and rhythm, S1/S2, no murmurs, no rubs, no gallops  Respiratory: Clear to auscultation bilaterally, no wheezing, no crackles, no rhonchi  Abdomen: Soft, non tender, non distended, bowel sounds present, no guarding  Extremities: No edema, pulses DP and PT palpable bilaterally  Data Reviewed: Basic Metabolic Panel:  Recent Labs Lab 12/16/13 1030 12/17/13 0310  NA 141 142  K 4.6 4.6  CL 107 112  CO2 20 17*  GLUCOSE 121* 89  BUN  44* 40*  CREATININE 2.97* 2.48*  CALCIUM 8.9 8.2*   Liver Function Tests:  Recent Labs Lab 12/16/13 1030 12/17/13 0310  AST 37 54*  ALT <5 <5  ALKPHOS 99 80  BILITOT 0.8 0.9  PROT 6.9 5.8*  ALBUMIN 3.8 3.0*   CBC:  Recent Labs Lab 12/16/13 1030 12/17/13 0310  WBC 6.9 7.2  NEUTROABS 5.1  --   HGB 11.2* 10.1*  HCT 34.0* 30.4*  MCV 87.6 86.9  PLT 116* PLATELET CLUMPS NOTED ON SMEAR, COUNT APPEARS DECREASED   Cardiac Enzymes:  Recent Labs Lab 12/16/13 1030 12/16/13 1514 12/16/13 2105 12/17/13 0310  TROPONINI <0.30 <0.30 <0.30 <0.30   CBG:  Recent Labs Lab 12/16/13 1617 12/16/13 2303 12/17/13 0141 12/17/13 0401 12/17/13 0744  GLUCAP 116* 122* 87 88 121*     Scheduled Meds: . aspirin EC  81 mg Oral Daily  . Carbidopa-Levodopa ER  1 tablet Oral TID  . docusate sodium  100 mg Oral BID  . furosemide  20 mg Intravenous Daily  . heparin  5,000 Units Subcutaneous Q8H  . insulin aspart  0-15 Units Subcutaneous Q4H  . lisinopril  10 mg Oral Daily  . LORazepam  1 mg Intravenous Once  . metoprolol  5 mg Intravenous Q12H  . pantoprazole ) IV  40 mg Intravenous Q12H  . QUEtiapine  25 mg Oral QHS  . simvastatin  20 mg Oral QHS  . tamsulosin  0.4 mg Oral Daily   Continuous Infusions: . dextrose 5 % and 0.45% NaCl 50 mL/hr at 12/17/13 0227    Debbora PrestoMAGICK-Allecia Bells, MD  [facility] Pager [phone]  If 7PM-7AM, please contact night-coverage [email or web address] Password [email or web address] 12/17/2013, 12:23 PM   LOS: 1 day

## 2013-12-17 NOTE — Evaluation (Signed)
Physical Therapy Evaluation Patient Details Name: [name] MRN: [id number] DOB: [birth year] Today's Date: 12/17/2013 Time: [id number] PT Time Calculation (min): 22 min  PT Assessment / Plan / Recommendation History of Present Illness  80 y.o. male with Parkinson's Disease and past medical history of hypertension, diabetes, chronic kidney disease, coronary artery disease, hyperlipidemia, and benign prostate hypertrophy. On the day prior to admission, the patient was found down at home on the floor after having sustained a fall.  They report that they feel it is not safe for him to be at home.  They're concerned about his syncopal episodes and increasing confusion.  The patient was evaluated in the emergency department and found to be confused and disoriented.   Clinical Impression  Patient continues to be unsafe to discharge home alone due to decreased balance and safety and will benefit from continued post-acute therapy. Currently recommending skilled nursing facility stay unless caregivers can provide 24 hour S, which at this time family cannot provide due to work schedules.      PT Assessment  Patient needs continued PT services    Follow Up Recommendations  SNF    Does the patient have the potential to tolerate intense rehabilitation      Barriers to Discharge        Equipment Recommendations  None recommended by PT    Recommendations for Other Services     Frequency Min 3X/week    Precautions / Restrictions Precautions Precautions: Fall   Pertinent Vitals/Pain No pain      Mobility  Bed Mobility Overal bed mobility: Needs Assistance;+2 for physical assistance Bed Mobility: Supine to Sit;Sit to Supine Supine to sit: +2 for physical assistance;Max assist Sit to supine: +2 for physical assistance;Total assist General bed mobility comments: used of bed pad to A with turning.  Pt attempting to help with supine to sit, but unable to assist with sit to  supine. Transfers Overall transfer level: Needs assistance Equipment used: 2 person hand held assist Transfers: Sit to/from Stand Sit to Stand: +2 physical assistance;Max assist;From elevated surface General transfer comment: elevated bed and allowed pt to pull up on PT and tech's hands. Ambulation/Gait Ambulation/Gait assistance: +2 physical assistance;Max assist Ambulation Distance (Feet): 3 Feet (side steps in front of bed) Assistive device: 2 person hand held assist Gait Pattern/deviations: Decreased step length - left General Gait Details: Pt had more difficulty moving L LE and needed cues to progress it.  Would take 2 steps to the R before moving 1 step with the Left.  knees blocked also.    Exercises     PT Diagnosis: Generalized weakness  PT Problem List: Decreased strength;Decreased activity tolerance;Decreased balance;Decreased mobility PT Treatment Interventions: Gait training;Functional mobility training;Therapeutic activities;Therapeutic exercise     PT Goals(Current goals can be found in the care plan section) Acute Rehab PT Goals Patient Stated Goal: Family would like to have pt go to SNF for safety. PT Goal Formulation: With patient Time For Goal Achievement: 12/31/13 Potential to Achieve Goals: Good  Visit Information  Last PT Received On: 12/17/13 Assistance Needed: +2 History of Present Illness: 80 y.o. male with Parkinson's Disease and past medical history of hypertension, diabetes, chronic kidney disease, coronary artery disease, hyperlipidemia, and benign prostate hypertrophy. On the day prior to admission, the patient was found down at home on the floor after having sustained a fall.  They report that they feel it is not safe for him to be at home.  They're concerned about his syncopal  episodes and increasing confusion.  The patient was evaluated in the emergency department and found to be confused and disoriented.        Prior Functioning  Home  Living Family/patient expects to be discharged to:: Skilled nursing facility Living Arrangements: Alone Additional Comments: Pt lived alone and amb  Prior Function Level of Independence: Independent with assistive device(s) Comments: Pt lived alone and amb with cane with slow shuffeling gait pattern.  CHildren helped him get ready for bed. Communication Communication: Other (comment) (difficult to understand)    Cognition  Cognition Arousal/Alertness: Lethargic (eyes closed most of eval.) Behavior During Therapy: WFL for tasks assessed/performed Overall Cognitive Status: Within Functional Limits for tasks assessed (but slower processing)    Extremity/Trunk Assessment Lower Extremity Assessment Lower Extremity Assessment: Generalized weakness;RLE deficits/detail;LLE deficits/detail RLE Deficits / Details: pt with spasms noted while in supine LLE Deficits / Details: pt with spasm noted while in supine   Balance Balance Overall balance assessment: Needs assistance Sitting-balance support: Feet supported;Bilateral upper extremity supported Sitting balance-Leahy Scale: Fair Standing balance support: Bilateral upper extremity supported Standing balance-Leahy Scale: Zero Standing balance comment: flexed knees  End of Session PT - End of Session Equipment Utilized During Treatment: Gait belt Activity Tolerance: Patient tolerated treatment well Patient left: in bed;with family/visitor present Nurse Communication:  (Spoke with CNA re: mobility)  GP     Jairy Angulo LUBECK 12/17/2013, 2:07 PM

## 2013-12-18 ENCOUNTER — Inpatient Hospital Stay (HOSPITAL_COMMUNITY): Payer: Medicare Other

## 2013-12-18 ENCOUNTER — Inpatient Hospital Stay (HOSPITAL_COMMUNITY): Admit: 2013-12-18 | Discharge: 2013-12-18 | Disposition: A | Discharge status: Home / Self Care | Payer: Medicare Other

## 2013-12-18 LAB — GLUCOSE, CAPILLARY
GLUCOSE-CAPILLARY: 69 mg/dL — AB (ref 70–99)
Glucose-Capillary: 100 mg/dL — ABNORMAL HIGH (ref 70–99)
Glucose-Capillary: 105 mg/dL — ABNORMAL HIGH (ref 70–99)
Glucose-Capillary: 163 mg/dL — ABNORMAL HIGH (ref 70–99)
Glucose-Capillary: 173 mg/dL — ABNORMAL HIGH (ref 70–99)

## 2013-12-18 LAB — CBC
HCT: 30.4 % — ABNORMAL LOW (ref 39.0–52.0)
Hemoglobin: 9.8 g/dL — ABNORMAL LOW (ref 13.0–17.0)
MCH: 28.5 pg (ref 26.0–34.0)
MCHC: 32.2 g/dL (ref 30.0–36.0)
MCV: 88.4 fL (ref 78.0–100.0)
PLATELETS: 111 10*3/uL — AB (ref 150–400)
RBC: 3.44 MIL/uL — ABNORMAL LOW (ref 4.22–5.81)
RDW: 15.9 % — AB (ref 11.5–15.5)
WBC: 5.6 10*3/uL (ref 4.0–10.5)

## 2013-12-18 LAB — BASIC METABOLIC PANEL
BUN: 33 mg/dL — ABNORMAL HIGH (ref 6–23)
CALCIUM: 8.2 mg/dL — AB (ref 8.4–10.5)
CO2: 21 mEq/L (ref 19–32)
Chloride: 111 mEq/L (ref 96–112)
Creatinine, Ser: 2.25 mg/dL — ABNORMAL HIGH (ref 0.50–1.35)
GFR calc Af Amer: 28 mL/min — ABNORMAL LOW (ref >90)
GFR, EST NON AFRICAN AMERICAN: 24 mL/min — AB (ref >90)
Glucose, Bld: 155 mg/dL — ABNORMAL HIGH (ref 70–99)
Potassium: 4 mEq/L (ref 3.7–5.3)
Sodium: 144 mEq/L (ref 137–147)

## 2013-12-18 MED ORDER — DEXTROSE 50 % IV SOLN
INTRAVENOUS | Status: AC
  Administered 2013-12-18: 50 mL
  Filled 2013-12-18: qty 50

## 2013-12-18 MED ORDER — DEXTROSE 50 % IV SOLN: 25.0000 mL | Freq: Once | INTRAVENOUS | Status: AC | PRN

## 2013-12-18 NOTE — Clinical Documentation Improvement (Signed)
Pt with CHF  Clarification Needed  Please clarify the acuity and type of CHF for this admission and document in pn or d/c summary    Possible Clinical Conditions?  Chronic Systolic Congestive Heart Failure Chronic Diastolic Congestive Heart Failure Chronic Systolic & Diastolic Congestive Heart Failure Acute Systolic Congestive Heart Failure Acute Diastolic Congestive Heart Failure Acute Systolic & Diastolic Congestive Heart Failure Acute on Chronic Systolic Congestive Heart Failure Acute on Chronic Diastolic Congestive Heart Failure Acute on Chronic Systolic & Diastolic Congestive Heart Failure Other Condition________________________________________ Cannot Clinically Determine  Supporting Information: ARF Moderate Malnutrition CHF DM2 Encephalopaty Risk Factors: Signs & Symptoms:   ED Note 12/16/13 furosemide (LASIX) 40 MG tablet  He exhibits edema (mild edema in bilateral LE's). H/P furosemide (LASIX) 40 MG tablet  Extremities: edema 1+pitting edema bilateral LEs  - recent ECHO 7 months ago, EF 50%.   Diagnostics: Component      Pro B Natriuretic peptide (BNP)  Latest Ref Rng      0 - 450 pg/mL  12/16/2013     3:14 PM 4795.0 (H)   Treatment: Lasix hydrALAZINE (APRESOLINE) tablet 10 mg   Daily weights   metoprolol (LOPRESSOR) injection 5 mg     Thank You, Enis SlipperLolita J Mycah Mcdougall ,\RN, BSN, MSN/Inf, CCDS Clinical Documentation Specialist [facility] Pager: [phone] / E-mail: [email or web address]  Clinical Documentation Specialist [facility] Pager: [phone] / E-mail: [email or web address]  [phone]  [place]- Health Information Management

## 2013-12-18 NOTE — Progress Notes (Addendum)
Patient ID: [name], male   DOB: 12/17/1921, 80 y.o.   MRN: [id number]  [facility] PROGRESS NOTE  Dylan Abtsverett W Rattan JYN:[id number] DOB: 12/17/1921 DOA: 12/16/2013 PCP: Delorse LekBURNETT,BRENT A, MD  Brief narrative: 80 y.o. male with Parkinson's Disease, hypertension, diabetes, chronic kidney disease, coronary artery disease, hyperlipidemia, and benign prostate hypertrophy. He has responded well to Sinemet and has been on this for years. [facility] for further evaluation of multiple recurrent falls, progressively worsening confusion, AMS, failure to thrive, poor oral intake. Pt apparently lives alone but children check up on him daily. They have noticed progressive FTT over the past several months.   Active Problems:  Recurrent falls  - secondary to progressive failure to thrive, deconditioning and functional quadriplegia  - SLP done, dysphagia I diet with thin liquids recommended and feeding when pt awake and alert - significant aspiration risk - PT evaluaiton done, discharge to SNF recommended, will discuss with family  Altered mental status  - acute on chronic encephalopathy  - secondary to progressive FTT and poor oral intake, ? Seizure activity  - MRI and EEG with no acute event that could explain AMS however, chronic microvascular changes noted  Acute on chronic renal failure  - secondary to poor oral intake and dehydration  - SLP done, recommended dysphagia I det with thin liquids  - placed on IVF, Cr trending down  HTN (hypertension)  - held Lasix and Lisinopril secondary to clinically dehydrated state and ARF  - placed on Hydralazine instead for BP control and BP improved - continue Metoprolol as well  Parkinson disease  - continue Sinemet, appreciate neurology input  CAD (coronary atherosclerotic disease), s/p CABG  - clinically stable  Type 2 diabetes mellitus  - continue SSI for now as pt's oral intake is still poor  Anemia of chronic disease  -  slight drop in Hg over 24 hours  - no signs of active bleed  - CBC in AM  Dementia  - progressive PT/OT evaluation pending  CHF (congestive heart failure, diastolic grade I, chronic   - pt still dry clinically, poor oral intake - daily weights, I's and O's  Moderate malnutrition  - secondary to acute on chronic progressive illnesses  - SLP done, rec's noted above   Consultants:  Neurology  Procedures/Studies:  Dg Chest 2 View 12/16/2013 No acute cardiopulmonary findings.  Dg Pelvis 1-2 Views 12/16/2013 No acute bony findings.  Ct Head Wo Contrast 12/16/2013 No acute intracranial abnormality. Moderate cerebral atrophy. Mild periventricular white matter decreased attenuation probable due to chronic small vessel ischemic changes. No cervical spine acute fracture or subluxation.  Mr Brain Wo Contrast   12/18/2013   Atrophy and very mild chronic microvascular ischemia. No acute abnormality.  Antibiotics:  None  Code Status: DNR  Family Communication: son and daughter at bedside   Disposition Plan: family agrees with SNF placement   HPI/Subjective: No events overnight.   Objective: Filed Vitals:   12/17/13 0537 12/17/13 1441 12/17/13 2145 12/18/13 0420  BP: 149/68 148/60 138/70 132/75  Pulse: 100 75 87 88  Temp: 98.3 F (36.8 C) 98.5 F (36.9 C) 98.7 F (37.1 C) 98.6 F (37 C)  TempSrc: Axillary Axillary Oral Oral  Resp: 18 16 18 18   Height:      Weight:    93.94 kg (207 lb 1.6 oz)  SpO2: 99% 100% 100% 100%    Intake/Output Summary (Last 24 hours) at 12/18/13 1640 Last data filed at 12/18/13  0600  Gross per 24 hour  Intake    750 ml  Output   2775 ml  Net  -2025 ml    Exam:   General:  Pt is somnolent, easy to arouse, non verbal this AM  Cardiovascular: Regular rate and rhythm, S1/S2, no murmurs, no rubs, no gallops  Respiratory: Clear to auscultation bilaterally, diminished breath sounds at bases   Abdomen: Soft, non tender, non distended, bowel sounds present,  no guarding  Extremities: No edema, pulses DP and PT palpable bilaterally  Data Reviewed: Basic Metabolic Panel:  Recent Labs Lab 12/16/13 1030 12/17/13 0310 12/18/13 0519  NA 141 142 144  K 4.6 4.6 4.0  CL 107 112 111  CO2 20 17* 21  GLUCOSE 121* 89 155*  BUN 44* 40* 33*  CREATININE 2.97* 2.48* 2.25*  CALCIUM 8.9 8.2* 8.2*   Liver Function Tests:  Recent Labs Lab 12/16/13 1030 12/17/13 0310  AST 37 54*  ALT <5 <5  ALKPHOS 99 80  BILITOT 0.8 0.9  PROT 6.9 5.8*  ALBUMIN 3.8 3.0*   CBC:  Recent Labs Lab 12/16/13 1030 12/17/13 0310 12/18/13 0519  WBC 6.9 7.2 5.6  NEUTROABS 5.1  --   --   HGB 11.2* 10.1* 9.8*  HCT 34.0* 30.4* 30.4*  MCV 87.6 86.9 88.4  PLT 116* PLATELET CLUMPS NOTED ON SMEAR, COUNT APPEARS DECREASED 111*   Cardiac Enzymes:  Recent Labs Lab 12/16/13 1030 12/16/13 1514 12/16/13 2105 12/17/13 0310  TROPONINI <0.30 <0.30 <0.30 <0.30   CBG:  Recent Labs Lab 12/17/13 2347 12/18/13 0419 12/18/13 0756 12/18/13 0817 12/18/13 1210  GLUCAP 163* 173* 69* 105* 100*   Scheduled Meds: . aspirin EC  81 mg Oral Daily  . Carbidopa-Levodopa ER  1 tablet Oral TID  . docusate sodium  100 mg Oral BID  . heparin  5,000 Units Subcutaneous Q8H  . hydrALAZINE  10 mg Oral Q8H  . insulin aspart  0-15 Units Subcutaneous Q4H  . LORazepam  1 mg Intravenous Once  . metoprolol  5 mg Intravenous Q12H  . pantoprazole (PROTONIX) IV  40 mg Intravenous Q12H  . QUEtiapine  25 mg Oral QHS  . simvastatin  20 mg Oral QHS  . sodium chloride  3 mL Intravenous Q12H  . tamsulosin  0.4 mg Oral Daily   Continuous Infusions: . dextrose 5 % and 0.45% NaCl 50 mL/hr at 12/17/13 1823   Debbora Presto, MD  [facility] Pager [phone]  If 7PM-7AM, please contact night-coverage [email or web address] Password [email or web address] 12/18/2013, 4:40 PM   LOS: 2 days

## 2013-12-18 NOTE — Evaluation (Signed)
Clinical/Bedside Swallow Evaluation Patient Details  Name: [name] MRN: [id number] Date of Birth: [birth year]  Today's Date: 12/18/2013 Time: 1410-1445 SLP Time Calculation (min): 35 min  Past Medical History:  Past Medical History  Diagnosis Date  . HTN (hypertension)   . Hyperlipemia   . Gait abnormality   . Macular degeneration   . Parkinson disease   . CAD (coronary atherosclerotic disease)   . S/P CABG (coronary artery bypass graft)   . Type 2 diabetes mellitus   . Anemia   . BPH (benign prostatic hyperplasia)   . Physical deconditioning   . CHF (congestive heart failure)    Past Surgical History:  Past Surgical History  Procedure Laterality Date  . Coronary artery bypass graft     HPI:  80 yo [facility] falling at home.  PMH + for dementia, parkinson's disease, CHF, DM2, CAD.  Pt with functional quadriparesis, ? seizure, ? deconditioning.  BSE ordered.     Assessment / Plan / Recommendation Clinical Impression  Pt presents with symptoms of mild suspected oropharyngeal dysphagia likely exacerbated by current lethargy.  SLP provided oral care prior to offering po intake.  Mild dried secretions removed from oral cavity.  Delayed swallow apparent but no clinical indications of aspiration or airway compromise.  At times, pt required verbal cues to swallow- likely due to lethargy.    Son Reita ClicheBobby present reports father ate well prior to admission.  Pt admits to occasional choking due to his Parkinson's disease.    Skiilled intervention included educating pt and son to precautions, effective compensation strategies and providing swallow precaution signs.     Rec initiate puree/thin diet when pt FULLY alert and with very strict aspiration precautions.  SLP to follow up to assess tolerance of po diet and educate family.      Aspiration Risk  Moderate    Diet Recommendation Dysphagia 1 (Puree);Thin liquid   Liquid Administration via: Cup;Straw Medication  Administration: Whole meds with puree Supervision: Full supervision/cueing for compensatory strategies Compensations: Slow rate;Small sips/bites;Check for pocketing (cue to swallow as needed, delayed swallow) Postural Changes and/or Swallow Maneuvers: Seated upright 90 degrees;Upright 30-60 min after meal    Other  Recommendations Oral Care Recommendations: Oral care BID   Follow Up Recommendations  Skilled Nursing facility    Frequency and Duration min 2x/week  2 weeks   Pertinent Vitals/Pain Afebrile, decreased    SLP Swallow Goals     Swallow Study Prior Functional Status   pt ate well PTA per son Bridgette HabermannBobby    General Date of Onset: 12/18/13 HPI: 80 [facility] home.  PMH + for dementia, parkinson's disease, CHF, DM2, CAD.  Pt with functional quadriparesis, ? seizure, ? deconditioning.  BSE ordered.   Type of Study: Bedside swallow evaluation Previous Swallow Assessment: MS 06-2007 no results available Diet Prior to this Study: NPO Temperature Spikes Noted: No Respiratory Status: Nasal cannula History of Recent Intubation: No Behavior/Cognition: Lethargic;Requires cueing;Doesn't follow directions;Decreased sustained attention;Hard of hearing Oral Cavity - Dentition: Poor condition Self-Feeding Abilities: Total assist Patient Positioning: Upright in bed Baseline Vocal Quality: Low vocal intensity;Clear Volitional Cough: Weak Volitional Swallow: Unable to elicit    Oral/Motor/Sensory Function Overall Oral Motor/Sensory Function: Impaired (grossly weak, otherwise no focal deficits)   Ice Chips Ice chips: Impaired Presentation: Spoon Oral Phase Impairments: Reduced lingual movement/coordination;Impaired anterior to posterior transit Oral Phase Functional Implications: Prolonged oral transit Pharyngeal Phase Impairments: Suspected delayed Swallow  Thin Liquid Thin Liquid: Impaired Presentation: Straw;Spoon;Cup Oral Phase Impairments: Impaired anterior  to posterior transit;Reduced lingual movement/coordination Oral Phase Functional Implications: Prolonged oral transit Pharyngeal  Phase Impairments: Suspected delayed Swallow    Nectar Thick Nectar Thick Liquid: Not tested   Honey Thick Honey Thick Liquid: Not tested   Puree Puree: Impaired Presentation: Spoon Oral Phase Impairments: Reduced lingual movement/coordination;Impaired anterior to posterior transit Oral Phase Functional Implications: Prolonged oral transit Pharyngeal Phase Impairments: Suspected delayed Swallow   Solid   GO    Solid: Not tested Other Comments: secondary to level of lethargy       Donavan Burnet, MS [facility] SLP [phone]  Mr Brain Wo Contrast  12/18/2013   CLINICAL DATA:  Confusion.  Rule out stroke.  EXAM: MRI HEAD WITHOUT CONTRAST  TECHNIQUE: Multiplanar, multiecho pulse sequences of the brain and surrounding structures were obtained without intravenous contrast.  COMPARISON:  CT head 12/16/2013  FINDINGS: Negative for acute infarct.  Generalized atrophy with Mild chronic microvascular ischemic change in the white matter. Small chronic infarcts in the cerebellum bilaterally. Brainstem is normal.  Negative for mass or edema. Negative for intracranial hemorrhage. No edema or shift of the midline structures.  Mild mucosal edema in the paranasal sinuses.  IMPRESSION: Atrophy and very mild chronic microvascular ischemia. No acute abnormality.   Electronically Signed   By: Marlan Palau M.D.   On: 12/18/2013 12:02

## 2013-12-18 NOTE — Progress Notes (Signed)
EEG completed offsite at [facility]. 

## 2013-12-18 NOTE — Progress Notes (Signed)
OT Cancellation Note  Patient Details Name: [name] MRN: [id number] DOB: January 07, 1922   Cancelled Treatment:    Reason Eval/Treat Not Completed: Other (comment) Note plan for SNF and pt currently +2 with standing/side stepping. Will defer OT eval to SNF.  Lennox LaityStone, Hinley Brimage [place] [phone] 12/18/2013, 12:15 PM

## 2013-12-18 NOTE — Progress Notes (Signed)
Hypoglycemic Event  CBG: 69  Treatment: D50 IV 25 mL  Symptoms: None  Follow-up CBG: Time:817a CBG Result:105  Possible Reasons for Event: Inadequate meal intake  Comments/MD notified:Yes    O'Berry, Jaxtin Raimondo D  Remember to initiate Hypoglycemia Order Set & complete

## 2013-12-18 NOTE — Progress Notes (Signed)
NEURO HOSPITALIST PROGRESS NOTE   SUBJECTIVE:                                                                                                                        Patient is mostly non vocal (other than a few sentences)and when attempts to arouse patient are made, he will start to swing arms in attempts to push me away.  HE will open his eyes to his name called and when asked to wake up he states "I do not want to"  OBJECTIVE:                                                                                                                           Vital signs in last 24 hours: Temp:  [98.5 F (36.9 C)-98.7 F (37.1 C)] 98.6 F (37 C) (01/12 0420) Pulse Rate:  [75-88] 88 (01/12 0420) Resp:  [16-18] 18 (01/12 0420) BP: (132-148)/(60-75) 132/75 mmHg (01/12 0420) SpO2:  [100 %] 100 % (01/12 0420) Weight:  [93.94 kg (207 lb 1.6 oz)] 93.94 kg (207 lb 1.6 oz) (01/12 0420)  Intake/Output from previous day: 01/11 0701 - 01/12 0700 In: 1154.2 [I.V.:1154.2] Out: 2775 [Urine:2775] Intake/Output this shift:   Nutritional status: NPO  Past Medical History  Diagnosis Date  . HTN (hypertension)   . Hyperlipemia   . Gait abnormality   . Macular degeneration   . Parkinson disease   . CAD (coronary atherosclerotic disease)   . S/P CABG (coronary artery bypass graft)   . Type 2 diabetes mellitus   . Anemia   . BPH (benign prostatic hyperplasia)   . Physical deconditioning   . CHF (congestive heart failure)      Neurologic Exam:  Mental Status: Resting comfortably upon entering room, when noxious stimulation given he will waken slightly, attempts to push me away. When awake resting tremor noted bilaterally but at rest tremor stops. Vocalizes only to state he does not want to wake up.  Cranial Nerves: II: Visual fields grossly normal, pupils equal, round, reactive to light and accommodation III,IV, VI: ptosis not present, extra-ocular motions intact  bilaterally --tracks me in room V,VII: smile symmetric, facial light touch sensation normal bilaterally VIII: hearing normal bilaterally IX,X: gag reflex present  XI: bilateral shoulder shrug XII: patient would not open mouth  Motor: Moving all extremities antigravity to noxious stimuli but would not take part in formal exam.  Resting tremor noted in bilateral upper extremities when awake or agitated but tremor stops when he falls asleep.  Tone is increased throughout when awake but normal at trest.  Sensory: responds to noxious stimuli throughout.  Deep Tendon Reflexes:  Right: Upper Extremity   Left: Upper extremity   biceps (C-5 to C-6) 2/4   biceps (C-5 to C-6) 2/4 tricep (C7) 2/4    triceps (C7) 2/4 Brachioradialis (C6) 2/4  Brachioradialis (C6) 2/4  Lower Extremity Lower Extremity  quadriceps (L-2 to L-4) 2/4   quadriceps (L-2 to L-4) 2/4 Achilles (S1) 0/4   Achilles (S1) 0/4  Plantars: equivocal bilaterally     Lab Results: No results found for this basename: cbc, bmp, coags, chol, tri, ldl, hga1c   Lipid Panel No results found for this basename: CHOL, TRIG, HDL, CHOLHDL, VLDL, LDLCALC,  in the last 72 hours  Studies/Results: Dg Chest 2 View  12/16/2013   CLINICAL DATA:  Larey SeatFell.  EXAM: CHEST  2 VIEW  COMPARISON:  07/06/2007.  FINDINGS: The heart is enlarged but stable. There are stable surgical changes from triple bypass surgery. There are chronic bronchitic type interstitial lung changes but no acute pulmonary findings. No pleural effusion or pneumothorax. The bony thorax is intact.  IMPRESSION: No acute cardiopulmonary findings.   Electronically Signed   By: Loralie ChampagneMark  Gallerani M.D.   On: 12/16/2013 10:41   Dg Pelvis 1-2 Views  12/16/2013   CLINICAL DATA:  Larey SeatFell.  Pelvic pain.  EXAM: PELVIS - 1-2 VIEW  COMPARISON:  CT scan 06/29/2007.  FINDINGS: Both hips are normally located mild degenerative changes. No acute fracture. The pubic symphysis SI joints are intact. No definite  pelvic fractures.  IMPRESSION: No acute bony findings.   Electronically Signed   By: Loralie ChampagneMark  Gallerani M.D.   On: 12/16/2013 10:40   Ct Head Wo Contrast  12/16/2013   CLINICAL DATA:  Fall, history of Parkinson's dementia  EXAM: CT HEAD WITHOUT CONTRAST  CT CERVICAL SPINE WITHOUT CONTRAST  TECHNIQUE: Multidetector CT imaging of the head and cervical spine was performed following the standard protocol without intravenous contrast. Multiplanar CT image reconstructions of the cervical spine were also generated.  COMPARISON:  09/06/2007  FINDINGS: CT HEAD FINDINGS  No skull fracture is noted. Paranasal sinuses and mastoid air cells are unremarkable.  No intracranial hemorrhage, mass effect or midline shift. Moderate cerebral atrophy. Mild periventricular white matter decreased attenuation probable due to chronic small vessel ischemic changes. Atherosclerotic calcifications of carotid siphon. No acute cortical infarction. No mass lesion is noted on this unenhanced scan.  CT CERVICAL SPINE FINDINGS  Axial images of the cervical spine shows no acute fracture or subluxation. There is bony fusion of C4-C5 vertebral body. Mild anterior and posterior spurring at C4-C5 level. Disc space flattening with mild anterior and mild posterior spurring at C5-C6 and C6-C7 level. Mild anterior spurring at C7-T1 level.  Degenerative changes are noted C1-C2 articulation. No prevertebral soft tissue swelling.  Multilevel facet degenerative changes are noted.  Bilateral apical scarring is noted. No evidence of pneumothorax in lung apices.  IMPRESSION: 1. No acute intracranial abnormality. Moderate cerebral atrophy. Mild periventricular white matter decreased attenuation probable due to chronic small vessel ischemic changes. 2. No cervical spine acute fracture or subluxation. Degenerative changes as described above.   Electronically Signed   By: Lang SnowLiviu  Pop M.D.   On: 12/16/2013 10:10   Ct Cervical Spine Wo Contrast  12/16/2013   CLINICAL  DATA:  Fall, history of Parkinson's dementia  EXAM: CT HEAD WITHOUT CONTRAST  CT CERVICAL SPINE WITHOUT CONTRAST  TECHNIQUE: Multidetector CT imaging of the head and cervical spine was performed following the standard protocol without intravenous contrast. Multiplanar CT image reconstructions of the cervical spine were also generated.  COMPARISON:  09/06/2007  FINDINGS: CT HEAD FINDINGS  No skull fracture is noted. Paranasal sinuses and mastoid air cells are unremarkable.  No intracranial hemorrhage, mass effect or midline shift. Moderate cerebral atrophy. Mild periventricular white matter decreased attenuation probable due to chronic small vessel ischemic changes. Atherosclerotic calcifications of carotid siphon. No acute cortical infarction. No mass lesion is noted on this unenhanced scan.  CT CERVICAL SPINE FINDINGS  Axial images of the cervical spine shows no acute fracture or subluxation. There is bony fusion of C4-C5 vertebral body. Mild anterior and posterior spurring at C4-C5 level. Disc space flattening with mild anterior and mild posterior spurring at C5-C6 and C6-C7 level. Mild anterior spurring at C7-T1 level.  Degenerative changes are noted C1-C2 articulation. No prevertebral soft tissue swelling.  Multilevel facet degenerative changes are noted.  Bilateral apical scarring is noted. No evidence of pneumothorax in lung apices.  IMPRESSION: 1. No acute intracranial abnormality. Moderate cerebral atrophy. Mild periventricular white matter decreased attenuation probable due to chronic small vessel ischemic changes. 2. No cervical spine acute fracture or subluxation. Degenerative changes as described above.   Electronically Signed   By: Natasha Mead M.D.   On: 12/16/2013 10:10    MEDICATIONS                                                                                                                        Scheduled: . aspirin EC  81 mg Oral Daily  . Carbidopa-Levodopa ER  1 tablet Oral TID  .  docusate sodium  100 mg Oral BID  . heparin  5,000 Units Subcutaneous Q8H  . hydrALAZINE  10 mg Oral Q8H  . insulin aspart  0-15 Units Subcutaneous Q4H  . LORazepam  1 mg Intravenous Once  . metoprolol  5 mg Intravenous Q12H  . pantoprazole (PROTONIX) IV  40 mg Intravenous Q12H  . QUEtiapine  25 mg Oral QHS  . simvastatin  20 mg Oral QHS  . sodium chloride  3 mL Intravenous Q12H  . tamsulosin  0.4 mg Oral Daily    ASSESSMENT/PLAN:  80 YO male with known PD with increased confusion/hallucinations and possible seizure activity.  Patient currently shows no epileptiform activity.  Awaiting EEG and MRI results.  Agree with continuation of sinemet at home dose ( would recommend talking with family to make sure he is getting Sinemet at the same time he does when at home.)    Assessment and plan discussed with with attending physician and they are in agreement.    Felicie Morn PA-C [facility] [phone]  12/18/2013, 9:31 AM

## 2013-12-18 NOTE — Evaluation (Signed)
{  EVAL NOTES:30SLP Cancellation Note  Patient Details Name: [name] MRN: [id number] DOB: 01-Jan-1922   Cancelled treatment:       Reason Eval/Treat Not Completed: Patient at procedure or test/unavailable (pt going for MRI)  Will re-attempt eval later, hopeful when pt fully alert. Rn reports pt received medication at 3 am (? Ativan).     Dylan Burnetamara Gearald Stonebraker, MS [facility] SLP [phone]

## 2013-12-18 NOTE — Progress Notes (Signed)
INITIAL NUTRITION ASSESSMENT  DOCUMENTATION CODES Per approved criteria  -Obesity Unspecified   INTERVENTION: -Monitor diet advancement and SLP evaluation recommendations -Consider supplementation if PO intake <75% est nutrition needs -Recommend to limit days w/out nutrition to less than five  NUTRITION DIAGNOSIS: Inadequate oral intake related to inability to eat as evidenced by NPO status.   Goal: Pt to meet >/= 90% of their estimated nutrition needs    Monitor:  Diet order, swallow profile, total protein/energy intake, labs, weights  Reason for Assessment: Low Braden   80 y.o. male  Admitting Dx: <principal problem not specified>  ASSESSMENT: [name] is a 80 y.o. male with Parkinson's Disease and past medical history of hypertension, diabetes, chronic kidney disease, coronary artery disease, hyperlipidemia, and benign prostate hypertrophy. He has responded well to Sinemet and has been on this for years His initial presentation was resting tremor, and gait difficulty, consistent with mild Parkinson's disease, He is taking Sinemet 25/100 mg 3 times a day with improvement in gait. Pt has had multiple falls at home but his physical deconditioning and falls have increased over the past 6 months. He has had increasing confusion and hallucinations over the past several months. He is also having episodes of spastic activity involving the upper and lower extremities. He doesn't have a history of epilepsy or previous seizure. He is taking anti-hypoglycemics for type 2 diabetes mellitus.  -Pt's family reported pt with decreased cognition and lethargy starting on Friday 1/09. -Daughter had attempted to feed pt on Friday, but pt was too confused to swallow. Has not had any PO intake since. Admitted with dehydration per MD -Denied any changes in appetite or weight prior to Friday -Pt was able to eat most diet textures, had some difficulty with meats d/t poor dentition. Had occasional  problems with thin liquids (coughing on water) -Failed initial swallow eval, SLP to reattempt today after pt completed MRI -Honored food preferences if pt able to eat: potatoes, eggs, and grits -MD noted pt may need SNF placement upon d/c d/t deconditing -Pt w/hx of CKD   Height: Ht Readings from Last 1 Encounters:  12/16/13 5\' 9"  (1.753 m)    Weight: Wt Readings from Last 1 Encounters:  12/18/13 207 lb 1.6 oz (93.94 kg)    Ideal Body Weight: 160 lbs  % Ideal Body Weight: 130%  Wt Readings from Last 10 Encounters:  12/18/13 207 lb 1.6 oz (93.94 kg)  11/24/13 206 lb (93.441 kg)  06/02/13 210 lb (95.255 kg)    Usual Body Weight: 207 lbs  % Usual Body Weight: 100%  BMI:  Body mass index is 30.57 kg/(m^2). Obesity  Estimated Nutritional Needs: Kcal: 1750-1950 Protein: 60-70 gram Fluid: 2000 ml/daily  Skin: WDL  Diet Order: NPO  EDUCATION NEEDS: -No education needs identified at this time   Intake/Output Summary (Last 24 hours) at 12/18/13 1239 Last data filed at 12/18/13 0600  Gross per 24 hour  Intake 1154.17 ml  Output   2775 ml  Net -1620.83 ml    Last BM: 1/08   Labs:   Recent Labs Lab 12/16/13 1030 12/17/13 0310 12/18/13 0519  NA 141 142 144  K 4.6 4.6 4.0  CL 107 112 111  CO2 20 17* 21  BUN 44* 40* 33*  CREATININE 2.97* 2.48* 2.25*  CALCIUM 8.9 8.2* 8.2*  GLUCOSE 121* 89 155*    CBG (last 3)   Recent Labs  12/18/13 0419 12/18/13 0756 12/18/13 0817  GLUCAP 173* 69* 105*  Scheduled Meds: . aspirin EC  81 mg Oral Daily  . Carbidopa-Levodopa ER  1 tablet Oral TID  . docusate sodium  100 mg Oral BID  . heparin  5,000 Units Subcutaneous Q8H  . hydrALAZINE  10 mg Oral Q8H  . insulin aspart  0-15 Units Subcutaneous Q4H  . LORazepam  1 mg Intravenous Once  . metoprolol  5 mg Intravenous Q12H  . pantoprazole (PROTONIX) IV  40 mg Intravenous Q12H  . QUEtiapine  25 mg Oral QHS  . simvastatin  20 mg Oral QHS  . sodium chloride  3  mL Intravenous Q12H  . tamsulosin  0.4 mg Oral Daily    Continuous Infusions: . dextrose 5 % and 0.45% NaCl 50 mL/hr at 12/17/13 1823    Past Medical History  Diagnosis Date  . HTN (hypertension)   . Hyperlipemia   . Gait abnormality   . Macular degeneration   . Parkinson disease   . CAD (coronary atherosclerotic disease)   . S/P CABG (coronary artery bypass graft)   . Type 2 diabetes mellitus   . Anemia   . BPH (benign prostatic hyperplasia)   . Physical deconditioning   . CHF (congestive heart failure)     Past Surgical History  Procedure Laterality Date  . Coronary artery bypass graft      Lloyd HugerSarah F Tenishia Ekman MS RD LDN Clinical Dietitian [phone]

## 2013-12-18 NOTE — Care Management Note (Addendum)
    Page 1 of 1   12/19/2013     12:24:33 PM   CARE MANAGEMENT NOTE 12/19/2013  Patient:  Melbourne AbtsASTER,Letroy W   Account Number:  [id number]  Date Initiated:  12/18/2013  Documentation initiated by:  Lanier ClamMAHABIR,Mika Griffitts  Subjective/Objective Assessment:   80 Y/O M ADMITTED W/FALL,AMS.ZO:XWRUEAVWU,JWJ,XBJHX:PARKINSON,HTN,CHF.     Action/Plan:   FROM HOME W/FAMILY.   Anticipated DC Date:  12/19/2013   Anticipated DC Plan:  SKILLED NURSING FACILITY      DC Planning Services  CM consult      Choice offered to / List presented to:             Status of service:  Completed, signed off Medicare Important Message given?   (If response is "NO", the following Medicare IM given date fields will be blank) Date Medicare IM given:   Date Additional Medicare IM given:    Discharge Disposition:  SKILLED NURSING FACILITY  Per UR Regulation:  Reviewed for med. necessity/level of care/duration of stay  If discussed at Long Length of Stay Meetings, dates discussed:    Comments:  12/19/13 Shiasia Porro RN,BSN NCM [phone] D/C SNF.  12/18/13 Marcellino Fidalgo RN,BSN NCM [phone] NEURO-EEG,MRI-NEG FOR ACUTE FINDINGS.NPO.FAILED SWALLOW SCREEN.PT-SNF.

## 2013-12-18 NOTE — Procedures (Signed)
ELECTROENCEPHALOGRAM REPORT  Patient: [name]       Room #: [id number] EEG No. ID: [id number] Age: 80 y.o.        Sex: male Referring Physician: Izola PriceMyers, I. Report Date:  12/18/2013        Interpreting Physician: Aline BrochureSTEWART,Maurissa Ambrose R  History: [name] is an 80 y.o. male history of Parkinson's disease and recent increase in confusion with hallucinations and equivocal seizure-like activity.  Indications for study:  Rule out seizure disorder.  Technique: This is an 18 channel routine scalp EEG performed at the bedside with bipolar and monopolar montages arranged in accordance to the international 10/20 system of electrode placement.   Description: The background activity consisted of mixed diffuse irregular low amplitude delta and theta activity continuously. Photic stimulation was not performed. Hyperventilation was not performed. No epileptiform discharges were recorded.  Interpretation: This EEG is abnormal with mild generalized nonspecific continuous slowing of cerebral activity. This pattern of slowing can be seen with a lot of variety of etiologies, including metabolic as well as degenerative disorders. No evidence of an epileptic disorder was demonstrated.   Venetia MaxonR Lexxus Underhill M.D. [facility] [phone]

## 2013-12-19 LAB — GLUCOSE, CAPILLARY
GLUCOSE-CAPILLARY: 102 mg/dL — AB (ref 70–99)
GLUCOSE-CAPILLARY: 119 mg/dL — AB (ref 70–99)
GLUCOSE-CAPILLARY: 248 mg/dL — AB (ref 70–99)
Glucose-Capillary: 136 mg/dL — ABNORMAL HIGH (ref 70–99)
Glucose-Capillary: 140 mg/dL — ABNORMAL HIGH (ref 70–99)
Glucose-Capillary: 178 mg/dL — ABNORMAL HIGH (ref 70–99)

## 2013-12-19 MED ORDER — HYDRALAZINE HCL 10 MG PO TABS: 10.0000 mg | ORAL_TABLET | Freq: Three times a day (TID) | ORAL | Status: AC

## 2013-12-19 MED ORDER — TRAMADOL HCL 50 MG PO TABS: 50.0000 mg | ORAL_TABLET | Freq: Four times a day (QID) | ORAL | Status: AC | PRN

## 2013-12-19 NOTE — Discharge Instructions (Signed)
Fall Prevention in Hospitals  As a hospital patient, your condition and the treatments you receive can increase your risk for falls. Some additional risk factors for falls in a hospital include:   Being in an unfamiliar environment.   Being on bed rest.   Your surgery.   Taking certain medicines.   Your tubing requirements, such as intravenous (IV) therapy or catheters.  It is important that you learn how to decrease fall risks while at the hospital. Below are important tips that can help prevent falls.  SAFETY TIPS FOR PREVENTING FALLS  Talk about your risk of falling.   Ask your caregiver why you are at risk for falling. Is it your medicine, illness, tubing placement, or something else?   Make a plan with your caregiver to keep you safe from falls.   Ask your caregiver or pharmacist about side effect of your medicines. Some medicines can make you dizzy or affect your coordination.  Ask for help.   Ask for help before getting out of bed. You may need to press your call button.   Ask for assistance in getting you safely to the toilet.   Ask for a walker or cane to be put at your bedside. Ask that most of the side rails on your bed be placed up before your caregiver leaves the room.   Ask family or friends to sit with you.   Ask for things that are out of your reach, such as your glasses, hearing aids, telephone, bedside table, or call button.  Follow these tips to avoid falling:   Stay lying or seated, rather than standing, while waiting for help.   Wear rubber-soled slippers or shoes whenever you walk in the hospital.   Avoid quick, sudden movements.   Change positions slowly.   Sit on the side of your bed before standing.   Stand up slowly and wait before you start to walk.   Let your caregiver know if there is a spill on the floor.   Pay careful attention to the medical equipment, electrical cords, and tubes around you.   When you need help, use your call button by your bed or in the  bathroom. Wait for one of your caregivers to help you.   If you feel dizzy or unsure of your footing, return to bed and wait for assistance.   Avoid being distracted by the TV, telephone, or another person in your room.   Do not lean or support yourself on rolling objects, such as IV poles or bedside tables.  Document Released: 11/20/2000 Document Revised: 11/09/2012 Document Reviewed: 07/31/2012  Exit[employer] Patient Information 2014 [employer].

## 2013-12-19 NOTE — Progress Notes (Signed)
NEURO HOSPITALIST PROGRESS NOTE   SUBJECTIVE:                                                                                                                        Patient is awake, oriented to place, date and year.  States he recalls falling and the "people putting him on th bed and wheeling him away".  He is following all commands. No further seizure activity noted.   OBJECTIVE:                                                                                                                           Vital signs in last 24 hours: Temp:  [97.4 F (36.3 C)-98.3 F (36.8 C)] 97.4 F (36.3 C) (01/13 0445) Pulse Rate:  [67-80] 67 (01/13 0445) Resp:  [16-22] 22 (01/13 0445) BP: (141-149)/(50-63) 141/50 mmHg (01/13 0445) SpO2:  [99 %-100 %] 99 % (01/13 0445) Weight:  [92.942 kg (204 lb 14.4 oz)] 92.942 kg (204 lb 14.4 oz) (01/13 0445)  Intake/Output from previous day: 01/12 0701 - 01/13 0700 In: 1490 [P.O.:240; I.V.:1250] Out: 850 [Urine:850] Intake/Output this shift:   Nutritional status: Dysphagia  Past Medical History  Diagnosis Date  . HTN (hypertension)   . Hyperlipemia   . Gait abnormality   . Macular degeneration   . Parkinson disease   . CAD (coronary atherosclerotic disease)   . S/P CABG (coronary artery bypass graft)   . Type 2 diabetes mellitus   . Anemia   . BPH (benign prostatic hyperplasia)   . Physical deconditioning   . CHF (congestive heart failure)      Neurologic Exam:   Mental Status: Alert, oriented.  Speech fluent without evidence of aphasia.  Able to follow simple commands without difficulty. Cranial Nerves: II: Visual fields grossly normal, pupils equal, round, reactive to light and accommodation III,IV, VI: ptosis not present, extra-ocular motions intact bilaterally V,VII: smile symmetric, right facial droop at rest,  facial light touch sensation normal bilaterally VIII: hearing decreased bilaterally IX,X:  gag reflex present XI: bilateral shoulder shrug XII: midline tongue extension without atrophy or fasciculations  Motor: Right : Upper extremity   5/5    Left:     Upper extremity  5/5  Lower extremity   5/5     Lower extremity   5/5 --mild increased tone throughout  Sensory: decreased sensation bilaterally in stocking distribution. Deep Tendon Reflexes:  1+ throughout UE, 1+ left KJ, 2+ right KJ, no AJ  Plantars: Right: downgoing   Left: downgoing   Lab Results: No results found for this basename: cbc, bmp, coags, chol, tri, ldl, hga1c   Lipid Panel No results found for this basename: CHOL, TRIG, HDL, CHOLHDL, VLDL, LDLCALC,  in the last 72 hours  Studies/Results: Mr Brain Wo Contrast  12/18/2013   CLINICAL DATA:  Confusion.  Rule out stroke.  EXAM: MRI HEAD WITHOUT CONTRAST  TECHNIQUE: Multiplanar, multiecho pulse sequences of the brain and surrounding structures were obtained without intravenous contrast.  COMPARISON:  CT head 12/16/2013  FINDINGS: Negative for acute infarct.  Generalized atrophy with Mild chronic microvascular ischemic change in the white matter. Small chronic infarcts in the cerebellum bilaterally. Brainstem is normal.  Negative for mass or edema. Negative for intracranial hemorrhage. No edema or shift of the midline structures.  Mild mucosal edema in the paranasal sinuses.  IMPRESSION: Atrophy and very mild chronic microvascular ischemia. No acute abnormality.   Electronically Signed   By: Marlan Palau M.D.   On: 12/18/2013 12:02   EEG:   This EEG is abnormal with mild generalized nonspecific continuous slowing of cerebral activity. This pattern of slowing can be seen with a lot of variety of etiologies, including metabolic as well as degenerative disorders. No evidence of an epileptic disorder was demonstrated.  MEDICATIONS                                                                                                                        Scheduled: .  aspirin EC  81 mg Oral Daily  . Carbidopa-Levodopa ER  1 tablet Oral TID  . docusate sodium  100 mg Oral BID  . heparin  5,000 Units Subcutaneous Q8H  . hydrALAZINE  10 mg Oral Q8H  . insulin aspart  0-15 Units Subcutaneous Q4H  . LORazepam  1 mg Intravenous Once  . metoprolol  5 mg Intravenous Q12H  . pantoprazole (PROTONIX) IV  40 mg Intravenous Q12H  . QUEtiapine  25 mg Oral QHS  . simvastatin  20 mg Oral QHS  . sodium chloride  3 mL Intravenous Q12H  . tamsulosin  0.4 mg Oral Daily    ASSESSMENT/PLAN:  80 YO male with known PD with increased confusion/hallucinations and possible seizure activity versus syncope with myoclonus. EEG showed no epileptiform activity. MRI showed no acute abnormality. Patient is more alert and talkative today.  Neurology will S/O  Recommend: 1) At this time would not initiate EAD 2) Continue Sinemet at home dose and times.    Assessment and plan discussed with with attending physician and they are in agreement.    Felicie MornDavid Smith PA-C [facility] [phone]  12/19/2013, 8:54 AM

## 2013-12-19 NOTE — Progress Notes (Signed)
Clinical Social Work Department CLINICAL SOCIAL WORK PLACEMENT NOTE 12/19/2013  Patient:  [name],[name]  Account Number:  [id number] Admit date:  12/16/2013  Clinical Social Worker:  Doroteo GlassmanAMANDA SIMPSON, LCSWA  Date/time:  12/17/2013 03:24 PM  Clinical Social Work is seeking post-discharge placement for this patient at the following level of care:   SKILLED NURSING   (*CSW will update this form in Epic as items are completed)   12/17/2013  Patient/family provided with [facility] Department of Clinical Social Work's list of facilities offering this level of care within the geographic area requested by the patient (or if unable, by the patient's family).  12/17/2013  Patient/family informed of their freedom to choose among providers that offer the needed level of care, that participate in Medicare, Medicaid or managed care program needed by the patient, have an available bed and are willing to accept the patient.  12/17/2013  Patient/family informed of [facility]' ownership interest in [facility], as well as of the fact that they are under no obligation to receive care at this facility.  PASARR submitted to EDS on 12/19/2013 PASARR number received from EDS on 12/19/2013  FL2 transmitted to all facilities in geographic area requested by pt/family on  12/17/2013 FL2 transmitted to all facilities within larger geographic area on   Patient informed that his/her managed care company has contracts with or will negotiate with  certain facilities, including the following:     Patient/family informed of bed offers received:  12/19/2013 [facility] Physician recommends and patient chooses bed at    [facility]  12/19/2013 Patient to be transferred to facility by ptar  The following physician request were entered in Epic:   Additional Comments:

## 2013-12-19 NOTE — Discharge Summary (Signed)
Physician Discharge Summary  [name]:[id number] DOB: [birth year] DOA: 12/16/2013  PCP: Delorse Lek, MD  Admit date: 12/16/2013 Discharge date: 12/19/2013  Recommendations for Outpatient Follow-up:  1. Pt will need to follow up with PCP in 2-3 weeks post discharge 2. Please obtain BMP to evaluate electrolytes and kidney function 3. Please also check CBC to evaluate Hg and Hct levels 4. Please note that Lisinopril was stopped due to acute on chronic renal failure and hydralazine was started instead for better BP control  5. Please note that Glipizide was stopped until pt's oral intake improves   Discharge Diagnoses: Altered mental status secondary to progressive FTT and Parkinson's Active Problems:   Abnormality of gait   Abnormal involuntary movements(781.0)   Fall   Altered mental status   Altered mental state   Hyperlipemia   HTN (hypertension)   Gait abnormality   Parkinson disease   CAD (coronary atherosclerotic disease)   S/P CABG (coronary artery bypass graft)   Type 2 diabetes mellitus   Anemia   Physical deconditioning   Dementia   CRF (chronic renal failure)   Dehydration   CHF (congestive heart failure)   Syncope and collapse   Discharge Condition: Stable  Diet recommendation: Heart healthy diet discussed in details   Brief narrative:  79 y.o. male with Parkinson's Disease, hypertension, diabetes, chronic kidney disease, coronary artery disease, hyperlipidemia, and benign prostate hypertrophy. He has responded well to Sinemet and has been on this for years. He has presented to [facility] ED for further evaluation of multiple recurrent falls, progressively worsening confusion, AMS, failure to thrive, poor oral intake. Pt apparently lives alone but children check up on him daily. They have noticed progressive FTT over the past several months.   Active Problems:  Recurrent falls  - secondary to progressive failure to thrive, deconditioning and functional  quadriplegia  - SLP done, dysphagia I diet with thin liquids recommended and feeding when pt awake and alert  - significant aspiration risk  - PT evaluaiton done, discharge to SNF recommended, family agrees  Altered mental status  - acute on chronic encephalopathy  - secondary to progressive FTT and poor oral intake, ? Seizure activity  - MRI and EEG with no acute event that could explain AMS however, chronic microvascular changes noted  Acute on chronic renal failure  - secondary to poor oral intake and dehydration  - SLP done, recommended dysphagia I det with thin liquids  - placed on IVF, Cr trending down  HTN (hypertension)  - please note that Lisinopril was stopped secondary to ARF  - placed on Hydralazine instead for BP control and BP improved  - continue Metoprolol as well  Parkinson disease  - continue Sinemet, appreciate neurology input  CAD (coronary atherosclerotic disease), s/p CABG  - clinically stable  Type 2 diabetes mellitus  - continue home medical regimen with actos but stop glipizide to avoid hypoglycemia until oral intake improves  Anemia of chronic disease  - slight drop in Hg over 24 hours  - no signs of active bleed  Dementia  - progressive PT/OT evaluation pending  CHF (congestive heart failure, diastolic grade I, chronic  - clinically compensated, resume lasix as per home medical regimen  Moderate malnutrition  - secondary to acute on chronic progressive illnesses  - SLP done, rec's noted above   Consultants:  Neurology  Procedures/Studies:  Dg Chest 2 View 12/16/2013 No acute cardiopulmonary findings.  Dg Pelvis 1-2 Views 12/16/2013 No acute bony findings.  Ct Head Wo Contrast 12/16/2013 No acute intracranial abnormality. Moderate cerebral atrophy. Mild periventricular white matter decreased attenuation probable due to chronic small vessel ischemic changes. No cervical spine acute fracture or subluxation.  Mr Brain Wo Contrast 12/18/2013 Atrophy and very  mild chronic microvascular ischemia. No acute abnormality.  Antibiotics:  None  Code Status: DNR  Family Communication: son and daughter at bedside  Disposition Plan: family agrees with SNF placement   Discharge Exam: Filed Vitals:   12/19/13 0445  BP: 141/50  Pulse: 67  Temp: 97.4 F (36.3 C)  Resp: 22   Filed Vitals:   12/17/13 2145 12/18/13 0420 12/18/13 2118 12/19/13 0445  BP: 138/70 132/75 149/63 141/50  Pulse: 87 88 80 67  Temp: 98.7 F (37.1 C) 98.6 F (37 C) 98.3 F (36.8 C) 97.4 F (36.3 C)  TempSrc: Oral Oral Oral Oral  Resp: 18 18 16 22   Height:      Weight:  93.94 kg (207 lb 1.6 oz)  92.942 kg (204 lb 14.4 oz)  SpO2: 100% 100% 100% 99%    General: Pt is somnolent but east to arouse, follows commands appropriately, not in acute distress Cardiovascular: Regular rate and rhythm, S1/S2 +, no murmurs, no rubs, no gallops Respiratory: Clear to auscultation bilaterally, no wheezing, no crackles, no rhonchi Abdominal: Soft, non tender, non distended, bowel sounds +, no guarding  Discharge Instructions  Discharge Orders   Future Appointments Provider Department Dept Phone   05/25/2014 3:00 PM Nilda Riggs, NP [facility] [phone]   Future Orders Complete By Expires   Diet - low sodium heart healthy  As directed    Increase activity slowly  As directed        Medication List    STOP taking these medications       lisinopril 20 MG tablet  Commonly known as:  PRINIVIL,ZESTRIL     Stop glipizide as well   TAKE these medications       aspirin EC 81 MG tablet  Take 81 mg by mouth daily.     Carbidopa-Levodopa ER 25-100 MG tablet controlled release  Commonly known as:  SINEMET CR  Take 1 tablet by mouth 3 (three) times daily.     FLOMAX 0.4 MG Caps capsule  Generic drug:  tamsulosin  Take 0.4 mg by mouth daily.     furosemide 40 MG tablet  Commonly known as:  LASIX  Take 20-40 mg by mouth See admin instructions. Take  one tablets (40 mg) alternating to 1/2 (20 mg) tablets as directed every other day.  Sunday-20 mg, Monday-20mg , etc.     hydrALAZINE 10 MG tablet  Commonly known as:  APRESOLINE  Take 1 tablet (10 mg total) by mouth every 8 (eight) hours.     KLOR-CON 10 10 MEQ tablet  Generic drug:  potassium chloride  Take 10 mEq by mouth daily.     metoprolol 50 MG tablet  Commonly known as:  LOPRESSOR  Take 50 mg by mouth 2 (two) times daily.     pioglitazone 30 MG tablet  Commonly known as:  ACTOS  Take 30 mg by mouth daily.     PROTONIX 40 MG tablet  Generic drug:  pantoprazole  Take 40 mg by mouth 2 (two) times daily.     QUEtiapine 25 MG tablet  Commonly known as:  SEROQUEL  Take 25 mg by mouth at bedtime.     traMADol 50 MG tablet  Commonly known as:  Janean Sark  Take 1 tablet (50 mg total) by mouth every 6 (six) hours as needed.     ZOCOR 20 MG tablet  Generic drug:  simvastatin  Take 20 mg by mouth at bedtime.           Follow-up Information   Follow up with BURNETT,BRENT A, MD In 2 weeks.   Specialty:  Family Medicine   Contact information:   [address] [place] [id number] [phone]        The results of significant diagnostics from this hospitalization (including imaging, microbiology, ancillary and laboratory) are listed below for reference.     Microbiology: No results found for this or any previous visit (from the past 240 hour(s)).   Labs: Basic Metabolic Panel:  Recent Labs Lab 12/16/13 1030 12/17/13 0310 12/18/13 0519  NA 141 142 144  K 4.6 4.6 4.0  CL 107 112 111  CO2 20 17* 21  GLUCOSE 121* 89 155*  BUN 44* 40* 33*  CREATININE 2.97* 2.48* 2.25*  CALCIUM 8.9 8.2* 8.2*   Liver Function Tests:  Recent Labs Lab 12/16/13 1030 12/17/13 0310  AST 37 54*  ALT <5 <5  ALKPHOS 99 80  BILITOT 0.8 0.9  PROT 6.9 5.8*  ALBUMIN 3.8 3.0*   CBC:  Recent Labs Lab 12/16/13 1030 12/17/13 0310 12/18/13 0519  WBC 6.9 7.2  5.6  NEUTROABS 5.1  --   --   HGB 11.2* 10.1* 9.8*  HCT 34.0* 30.4* 30.4*  MCV 87.6 86.9 88.4  PLT 116* PLATELET CLUMPS NOTED ON SMEAR, COUNT APPEARS DECREASED 111*   Cardiac Enzymes:  Recent Labs Lab 12/16/13 1030 12/16/13 1514 12/16/13 2105 12/17/13 0310  TROPONINI <0.30 <0.30 <0.30 <0.30   BNP: BNP (last 3 results)  Recent Labs  12/16/13 1514  PROBNP 4795.0*   CBG:  Recent Labs Lab 12/18/13 1654 12/18/13 2115 12/19/13 0043 12/19/13 0426 12/19/13 0739  GLUCAP 140* 178* 136* 102* 119*     SIGNED: Time coordinating discharge: Over 30 minutes  Debbora PrestoMAGICK-Anelia Carriveau, MD  [facility] 12/19/2013, 10:45 AM Pager [phone]  If 7PM-7AM, please contact night-coverage [email or web address] Password [email or web address]

## 2013-12-19 NOTE — Progress Notes (Signed)
[facility]

## 2013-12-19 NOTE — Progress Notes (Signed)
CSW met with patient's daughter at bedside. She is thrilled [facility]. She is anxious for patient to be transferred over there before the weather gets bad today. CSW assured her that everything is ready and CSW can call ambulance right now. Patient was sleeping peacefully. Daughter was anxious about telling patient he is going to be going to snf. She is afraid that he will be upset when he realizes. CSW offered emotional support on patients current mental status. Packet copied and placed in [place]. ptar called for transportation.  Rhyse Loux C. [place] MSW, [place]

## 2014-02-04 DEATH — deceased

## 2014-05-25 ENCOUNTER — Ambulatory Visit: Payer: Medicare Other | Admitting: Nurse Practitioner
